# Patient Record
Sex: Male | Born: 1980 | Race: Black or African American | Hispanic: No | Marital: Single | Smoking: Former smoker
Health system: Southern US, Community
[De-identification: ages and names within clinical notes are randomized; demographics above are authoritative.]

## PROBLEM LIST (undated history)

## (undated) DIAGNOSIS — F1410 Cocaine abuse, uncomplicated: Secondary | ICD-10-CM

## (undated) DIAGNOSIS — K029 Dental caries, unspecified: Secondary | ICD-10-CM

## (undated) DIAGNOSIS — F329 Major depressive disorder, single episode, unspecified: Secondary | ICD-10-CM

## (undated) DIAGNOSIS — F32A Depression, unspecified: Secondary | ICD-10-CM

---
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
## 2001-09-10 ENCOUNTER — Emergency Department (HOSPITAL_COMMUNITY): Admission: EM | Admit: 2001-09-10 | Discharge: 2001-09-10 | Payer: Self-pay | Admitting: Emergency Medicine

## 2003-02-15 ENCOUNTER — Encounter: Payer: Self-pay | Admitting: *Deleted

## 2003-02-15 ENCOUNTER — Emergency Department (HOSPITAL_COMMUNITY): Admission: EM | Admit: 2003-02-15 | Discharge: 2003-02-15 | Payer: Self-pay | Admitting: *Deleted

## 2003-08-11 ENCOUNTER — Emergency Department (HOSPITAL_COMMUNITY): Admission: EM | Admit: 2003-08-11 | Discharge: 2003-08-11 | Payer: Self-pay | Admitting: Emergency Medicine

## 2004-11-23 ENCOUNTER — Emergency Department (HOSPITAL_COMMUNITY): Admission: EM | Admit: 2004-11-23 | Discharge: 2004-11-23 | Payer: Self-pay | Admitting: Emergency Medicine

## 2005-11-17 ENCOUNTER — Emergency Department (HOSPITAL_COMMUNITY): Admission: EM | Admit: 2005-11-17 | Discharge: 2005-11-18 | Payer: Self-pay | Admitting: Emergency Medicine

## 2005-11-30 ENCOUNTER — Emergency Department (HOSPITAL_COMMUNITY): Admission: AD | Admit: 2005-11-30 | Discharge: 2005-11-30 | Payer: Self-pay | Admitting: Family Medicine

## 2007-05-29 ENCOUNTER — Emergency Department (HOSPITAL_COMMUNITY): Admission: EM | Admit: 2007-05-29 | Discharge: 2007-05-29 | Payer: Self-pay | Admitting: Emergency Medicine

## 2007-09-17 ENCOUNTER — Emergency Department (HOSPITAL_COMMUNITY): Admission: EM | Admit: 2007-09-17 | Discharge: 2007-09-17 | Payer: Self-pay | Admitting: Family Medicine

## 2007-11-01 ENCOUNTER — Emergency Department (HOSPITAL_COMMUNITY): Admission: EM | Admit: 2007-11-01 | Discharge: 2007-11-01 | Payer: Self-pay | Admitting: Emergency Medicine

## 2008-05-19 ENCOUNTER — Emergency Department (HOSPITAL_COMMUNITY): Admission: EM | Admit: 2008-05-19 | Discharge: 2008-05-19 | Payer: Self-pay | Admitting: Emergency Medicine

## 2010-03-31 ENCOUNTER — Emergency Department (HOSPITAL_COMMUNITY): Admission: EM | Admit: 2010-03-31 | Discharge: 2010-03-31 | Payer: Self-pay | Admitting: Emergency Medicine

## 2010-04-02 ENCOUNTER — Emergency Department (HOSPITAL_COMMUNITY)
Admission: EM | Admit: 2010-04-02 | Discharge: 2010-04-02 | Payer: Self-pay | Source: Home / Self Care | Admitting: Family Medicine

## 2010-06-15 ENCOUNTER — Inpatient Hospital Stay (INDEPENDENT_AMBULATORY_CARE_PROVIDER_SITE_OTHER)
Admission: RE | Admit: 2010-06-15 | Discharge: 2010-06-15 | Disposition: A | Discharge status: Home / Self Care | Payer: Self-pay | Source: Ambulatory Visit | Attending: Family Medicine | Admitting: Family Medicine

## 2010-06-15 DIAGNOSIS — K602 Anal fissure, unspecified: Secondary | ICD-10-CM

## 2010-06-15 LAB — OCCULT BLOOD, POC DEVICE: Fecal Occult Bld: NEGATIVE

## 2010-06-19 ENCOUNTER — Emergency Department (HOSPITAL_COMMUNITY)
Admission: EM | Admit: 2010-06-19 | Discharge: 2010-06-19 | Disposition: A | Discharge status: Home / Self Care | Payer: Self-pay | Attending: Emergency Medicine | Admitting: Emergency Medicine

## 2010-06-19 DIAGNOSIS — J020 Streptococcal pharyngitis: Secondary | ICD-10-CM | POA: Insufficient documentation

## 2010-06-19 DIAGNOSIS — K122 Cellulitis and abscess of mouth: Secondary | ICD-10-CM | POA: Insufficient documentation

## 2010-06-19 DIAGNOSIS — R1310 Dysphagia, unspecified: Secondary | ICD-10-CM | POA: Insufficient documentation

## 2011-02-01 LAB — GC/CHLAMYDIA PROBE AMP, GENITAL
Chlamydia, DNA Probe: POSITIVE — AB
GC Probe Amp, Genital: POSITIVE — AB

## 2011-11-16 ENCOUNTER — Encounter (HOSPITAL_COMMUNITY): Payer: Self-pay

## 2011-11-16 ENCOUNTER — Emergency Department (HOSPITAL_COMMUNITY)
Admission: EM | Admit: 2011-11-16 | Discharge: 2011-11-16 | Disposition: A | Discharge status: Home / Self Care | Payer: Medicaid Other | Attending: Emergency Medicine | Admitting: Emergency Medicine

## 2011-11-16 DIAGNOSIS — R32 Unspecified urinary incontinence: Secondary | ICD-10-CM | POA: Insufficient documentation

## 2011-11-16 LAB — URINALYSIS, ROUTINE W REFLEX MICROSCOPIC
Bilirubin Urine: NEGATIVE
Hgb urine dipstick: NEGATIVE
Ketones, ur: NEGATIVE mg/dL
Nitrite: NEGATIVE
pH: 7 (ref 5.0–8.0)

## 2011-11-16 LAB — URINE MICROSCOPIC-ADD ON

## 2011-11-16 NOTE — ED Notes (Addendum)
States unable to hold urine at night states ongoing for several years, denies pain or discomfort states wants to know why this is going on, denies pain or discomfort

## 2011-11-16 NOTE — ED Provider Notes (Signed)
History     CSN: [id number]  Arrival date & time 11/16/11  1520   First MD Initiated Contact with Patient 11/16/11 1604      Chief Complaint  Patient presents with  . Urinary Incontinence    (Consider location/radiation/quality/duration/timing/severity/associated sxs/prior treatment) HPI Comments: Patient here requesting evaluation for episodic periods of incontinence.  He states that since he was a child, he has been having episodes of spontaneous nocturnal incontinence of urine.  He states that these occur about twice a month, he states that when he was a child, his PCP checked this out but did no tests.  He states that when they occur he has no sensation about the need to urinate, that he wakes up wet.  He denies any other symptoms including pain, spasms, fever, chills, dysuria, hematuria, nausea, vomiting, urethral discharge, penile pain, erectile dysfunction.  Patient is a 31 y.o. male presenting with male genitourinary complaint. The history is provided by the patient. No language interpreter was used.  Male GU Problem Primary symptoms include no dysuria, no genital itching, no genital lesions, no genital rash, no penile discharge, no penile pain, no priapism, and no scrotal pain. This is a chronic problem. The current episode started more than 1 week ago. The problem occurs every several days. The problem has not changed since onset.Pertinent negatives include no anorexia, no diaphoresis, no nausea, no vomiting, no abdominal pain, no abdominal swelling, no frequency, no constipation and no diarrhea. There has been no fever. He has tried nothing for the symptoms. The treatment provided no relief. Sexual activity: non-contributory.    History reviewed. No pertinent past medical history.  History reviewed. No pertinent past surgical history.  History reviewed. No pertinent family history.  History  Substance Use Topics  . Smoking status: Never Smoker   . Smokeless tobacco: Not on  file  . Alcohol Use: No      Review of Systems  Constitutional: Negative for diaphoresis.  Gastrointestinal: Negative for nausea, vomiting, abdominal pain, diarrhea, constipation and anorexia.  Genitourinary: Negative for dysuria, frequency, hematuria, decreased urine volume, discharge, penile pain, testicular pain and penile discharge.  All other systems reviewed and are negative.    Allergies  Review of patient's allergies indicates no known allergies.  Home Medications  No current outpatient prescriptions on file.  BP 112/76  Pulse 66  Temp 98.6 F (37 C) (Oral)  Resp 16  SpO2 100%  Physical Exam  Nursing note and vitals reviewed. Constitutional: He is oriented to person, place, and time. He appears well-developed and well-nourished. No distress.  HENT:  Head: Normocephalic and atraumatic.  Right Ear: External ear normal.  Left Ear: External ear normal.  Nose: Nose normal.  Mouth/Throat: Oropharynx is clear and moist. No oropharyngeal exudate.  Eyes: Conjunctivae are normal. Pupils are equal, round, and reactive to light. No scleral icterus.  Neck: Normal range of motion. Neck supple.  Cardiovascular: Normal rate, regular rhythm and normal heart sounds.  Exam reveals no gallop and no friction rub.   No murmur heard. Pulmonary/Chest: Effort normal and breath sounds normal. No respiratory distress. He has no wheezes. He has no rales. He exhibits no tenderness.  Abdominal: Soft. Bowel sounds are normal. He exhibits no distension. There is no tenderness.  Genitourinary: Penis normal. Right testis shows no mass and no tenderness. Left testis shows no mass and no tenderness.  Musculoskeletal: Normal range of motion. He exhibits no edema and no tenderness.  Lymphadenopathy:    He has no cervical  adenopathy.  Neurological: He is alert and oriented to person, place, and time. No cranial nerve deficit. He exhibits normal muscle tone. Coordination normal.  Skin: Skin is warm  and dry. No rash noted. No erythema. No pallor.  Psychiatric: He has a normal mood and affect. His behavior is normal. Judgment and thought content normal.    ED Course  Procedures (including critical care time)  Labs Reviewed  URINALYSIS, ROUTINE W REFLEX MICROSCOPIC - Abnormal; Notable for the following:    Protein, ur 30 (*)     Leukocytes, UA TRACE (*)     All other components within normal limits  URINE MICROSCOPIC-ADD ON   No results found.   1. Paradoxic incontinence       MDM  Patient with normal urine and bladder scan, he has been referred to Urology for further evaluation of this.  I do not suspect neurogenic bladder, spinal cord injury.        Izola Price [place], [place] 11/16/11 1802

## 2011-11-17 NOTE — ED Provider Notes (Signed)
Medical screening examination/treatment/procedure(s) were performed by non-physician practitioner and as supervising physician I was immediately available for consultation/collaboration.  Hurman Horn, MD 11/17/11 1239

## 2012-03-29 ENCOUNTER — Emergency Department (HOSPITAL_COMMUNITY)
Admission: EM | Admit: 2012-03-29 | Discharge: 2012-03-29 | Disposition: A | Discharge status: Home / Self Care | Payer: Self-pay | Attending: Emergency Medicine | Admitting: Emergency Medicine

## 2012-03-29 ENCOUNTER — Encounter (HOSPITAL_COMMUNITY): Payer: Self-pay | Admitting: *Deleted

## 2012-03-29 DIAGNOSIS — R0789 Other chest pain: Secondary | ICD-10-CM

## 2012-03-29 DIAGNOSIS — F17200 Nicotine dependence, unspecified, uncomplicated: Secondary | ICD-10-CM | POA: Insufficient documentation

## 2012-03-29 DIAGNOSIS — R071 Chest pain on breathing: Secondary | ICD-10-CM | POA: Insufficient documentation

## 2012-03-29 MED ORDER — IBUPROFEN 800 MG PO TABS
800.0000 mg | ORAL_TABLET | Freq: Once | ORAL | Status: AC
  Administered 2012-03-29: 800 mg via ORAL
  Filled 2012-03-29: qty 1

## 2012-03-29 MED ORDER — IBUPROFEN 800 MG PO TABS: 800.0000 mg | ORAL_TABLET | Freq: Three times a day (TID) | ORAL | Status: DC | PRN

## 2012-03-29 NOTE — ED Notes (Signed)
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 Pain x3 days. Pain upon inspiration 3/10. Pt denies falls/ trauma. Pt denies SOB

## 2012-03-29 NOTE — ED Provider Notes (Signed)
History     CSN: [id number]  Arrival date & time 03/29/12  1601   First MD Initiated Contact with Patient 03/29/12 1811      No chief complaint on file.   (Consider location/radiation/quality/duration/timing/severity/associated sxs/prior treatment) HPI Comments: Patient reports pain and tenderness over his right rib cage.  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  Presents today because he has been having constant pain that does not go away and he was concerned.  Pain is described as a cramp, does not radiate, is 2/10 intensity.  Has taken laxative, given by his mother, without change.  Pain is worse with movement and with deep respiration.  Denies fevers, cough, hemoptysis, SOB, abdominal pain, N/V/D, change in bowel habits, urinary symptoms.  He denies swelling of the legs, recent immobilization, any personal or family hx of blood clots.    The history is provided by the patient.    History reviewed. No pertinent past medical history.  History reviewed. No pertinent past surgical history.  History reviewed. No pertinent family history.  History  Substance Use Topics  . Smoking status: Current Every Day Smoker -- 0.3 packs/day for 15 years    Types: Cigarettes  . Smokeless tobacco: Not on file  . Alcohol Use: Yes     Comment: 3 shots/day      Review of Systems  Constitutional: Negative for fever and chills.  HENT: Negative for neck pain.   Respiratory: Negative for cough and shortness of breath.   Cardiovascular: Positive for chest pain. Negative for leg swelling.  Gastrointestinal: Negative for nausea, vomiting, abdominal pain and diarrhea.  Genitourinary: Negative for dysuria, urgency and frequency.  Skin: Negative for rash and wound.    Allergies  Review of patient's allergies indicates no known allergies.  Home Medications   Current Outpatient Rx  Name  Route  Sig   Dispense  Refill  . IBUPROFEN 800 MG PO TABS   Oral   Take 1 tablet (800 mg total) by mouth every 8 (eight) hours as needed for pain.   21 tablet   0     BP 126/74  Pulse 81  Temp 99 F (37.2 C) (Oral)  Resp 16  SpO2 100%  Physical Exam  Nursing note and vitals reviewed. Constitutional: He appears well-developed and well-nourished. No distress.  HENT:  Head: Normocephalic and atraumatic.  Neck: Neck supple.  Cardiovascular: Normal rate and regular rhythm.   Pulmonary/Chest: Effort normal and breath sounds normal. No respiratory distress. He has no wheezes. He has no rales. He exhibits tenderness.         Right chest wall tender to palpation, palpation reproduces the symptoms.    Abdominal: Soft. He exhibits no distension and no mass. There is no tenderness. There is no rebound and no guarding.  Neurological: He is alert. He exhibits normal muscle tone.  Skin: He is not diaphoretic.    ED Course  Procedures (including critical care time)  Labs Reviewed - No data to display No results found.   1. Chest wall pain     MDM  Afebrile, nontoxic, comfortable appearing male with right rib tenderness.  No known injury but occurred after patient had been doing repetitive task while bending over at work.  Pain reproducible with palpation.  Lungs are clear, no SOB, hemoptysis, cough.  O2 sat is 100% on room air, HR 81.  Very low risk  for PE.  Doubt PE clinically.  Abdomen is nontender.  No symptoms or exam findings to suggest intraabdominal process including cholecystitis.  Doubt ACS.  Likely musculoskeletal.  Pt d/c home with NSAIDs, PCP resources for follow up.  Discussed diagnosis, treatment plan, and follow up with patient.  Pt given return precautions.  Pt verbalizes understanding and agrees with plan.           [place], [place] 03/30/12 [phone]

## 2012-03-30 NOTE — ED Provider Notes (Signed)
Medical screening examination/treatment/procedure(s) were performed by non-physician practitioner and as supervising physician I was immediately available for consultation/collaboration.  Rica Heather R. Jaid Quirion, MD 03/30/12 2031 

## 2012-07-04 ENCOUNTER — Emergency Department (HOSPITAL_COMMUNITY)
Admission: EM | Admit: 2012-07-04 | Discharge: 2012-07-04 | Disposition: A | Discharge status: Home / Self Care | Payer: Self-pay | Attending: Emergency Medicine | Admitting: Emergency Medicine

## 2012-07-04 ENCOUNTER — Encounter (HOSPITAL_COMMUNITY): Payer: Self-pay | Admitting: Emergency Medicine

## 2012-07-04 DIAGNOSIS — R1084 Generalized abdominal pain: Secondary | ICD-10-CM | POA: Insufficient documentation

## 2012-07-04 DIAGNOSIS — R197 Diarrhea, unspecified: Secondary | ICD-10-CM | POA: Insufficient documentation

## 2012-07-04 DIAGNOSIS — R1110 Vomiting, unspecified: Secondary | ICD-10-CM | POA: Insufficient documentation

## 2012-07-04 DIAGNOSIS — F17200 Nicotine dependence, unspecified, uncomplicated: Secondary | ICD-10-CM | POA: Insufficient documentation

## 2012-07-04 DIAGNOSIS — R6883 Chills (without fever): Secondary | ICD-10-CM | POA: Insufficient documentation

## 2012-07-04 MED ORDER — ONDANSETRON 8 MG PO TBDP: ORAL_TABLET | ORAL | Status: DC

## 2012-07-04 MED ORDER — METOCLOPRAMIDE HCL 10 MG PO TABS: 10.0000 mg | ORAL_TABLET | Freq: Four times a day (QID) | ORAL | Status: DC | PRN

## 2012-07-04 MED ORDER — ONDANSETRON 8 MG PO TBDP
8.0000 mg | ORAL_TABLET | Freq: Once | ORAL | Status: DC
  Filled 2012-07-04: qty 1

## 2012-07-04 MED ORDER — LOPERAMIDE HCL 2 MG PO CAPS: ORAL_CAPSULE | ORAL | Status: DC

## 2012-07-04 MED ORDER — ONDANSETRON 4 MG PO TBDP
4.0000 mg | ORAL_TABLET | Freq: Once | ORAL | Status: AC
  Administered 2012-07-04: 4 mg via ORAL
  Filled 2012-07-04: qty 1

## 2012-07-04 NOTE — ED Provider Notes (Signed)
History     CSN: [id number]  Arrival date & time 07/04/12  1655   First MD Initiated Contact with Patient 07/04/12 1909      Chief Complaint  Patient presents with  . Emesis  . Diarrhea    (Consider location/radiation/quality/duration/timing/severity/associated sxs/prior treatment) HPI This 32 year old male has one day of multiple episodes each of nonbloody vomiting and diarrhea with intermittent diffuse crampy abdominal pain but no constant or severe or localized abdominal pain, he did have some chills today he has no cough chest pain shortness of breath testicular pain dysuria body aches or other concerns. There is no treatment prior to arrival. No past medical history on file.  No past surgical history on file.  No family history on file.  History  Substance Use Topics  . Smoking status: Current Every Day Smoker -- 0.30 packs/day for 15 years    Types: Cigarettes  . Smokeless tobacco: Never Used  . Alcohol Use: Yes     Comment: 3 shots/day, wine red      Review of Systems 10 Systems reviewed and are negative for acute change except as noted in the HPI. Allergies  Review of patient's allergies indicates no known allergies.  Home Medications   Current Outpatient Rx  Name  Route  Sig  Dispense  Refill  . loperamide (IMODIUM) 2 MG capsule      Take two tabs po initially, then one tab after each loose stool: max 8 tabs in 24 hours   12 capsule   0   . metoCLOPramide (REGLAN) 10 MG tablet   Oral   Take 1 tablet (10 mg total) by mouth every 6 (six) hours as needed (nausea/headache).   6 tablet   0   . ondansetron (ZOFRAN ODT) 8 MG disintegrating tablet      8mg  ODT q4 hours prn nausea   4 tablet   0     BP 130/84  Pulse 94  Temp(Src) 97.7 F (36.5 C) (Oral)  Resp 18  SpO2 98%  Physical Exam  Nursing note and vitals reviewed. Constitutional:  Awake, alert, nontoxic appearance.  HENT:  Head: Atraumatic.  Eyes: Right eye exhibits no discharge.  Left eye exhibits no discharge.  Neck: Neck supple.  Cardiovascular: Normal rate and regular rhythm.   No murmur heard. Pulmonary/Chest: Effort normal and breath sounds normal. No respiratory distress. He has no wheezes. He has no rales. He exhibits no tenderness.  Abdominal: Soft. Bowel sounds are normal. He exhibits no distension and no mass. There is no tenderness. There is no rebound and no guarding.  Genitourinary:  Testicles are nontender, no palpable inguinal hernias, no CVA tenderness  Musculoskeletal: He exhibits no tenderness.  Baseline ROM, no obvious new focal weakness.  Neurological:  Mental status and motor strength appears baseline for patient and situation.  Skin: No rash noted.  Psychiatric: He has a normal mood and affect.    ED Course  Procedures (including critical care time) Pt feels improved after observation and/or treatment in ED. Labs Reviewed - No data to display No results found.   1. Vomiting and diarrhea       MDM  Pt stable in ED with no significant deterioration in condition.  Patient / Family / Caregiver informed of clinical course, understand medical decision-making process, and agree with plan.  I doubt any other EMC precluding discharge at this time including, but not necessarily limited to the following:sepsis.        Hurman Horn, MD  07/13/12 1640 

## 2012-07-04 NOTE — ED Notes (Signed)
Pt presents w/ onset of emesis and diarrhea this morning. Denies abdominal pain except muscular pain from emesis. Pt repeatedly demanding ice water during triage.. Endorses nausea at present.

## 2012-07-04 NOTE — ED Notes (Signed)
Writer reviewed discharge instructions with patient. Patient requested a wheelchair to be taken to discharge as well as additional nausea medication prior to discharge. Patient requested to remain in room while orders for medication were sought. Orders received. When writer returned to patient room to give medication and discharge patient, he was not in the room. [facility] office does not have record of patient checking out, restrooms checked, patient not found. Patient left without signing.

## 2012-12-25 ENCOUNTER — Emergency Department (HOSPITAL_COMMUNITY)
Admission: EM | Admit: 2012-12-25 | Discharge: 2012-12-26 | Disposition: A | Discharge status: Other Acute Inpatient Hospital | Payer: Self-pay | Attending: Emergency Medicine | Admitting: Emergency Medicine

## 2012-12-25 ENCOUNTER — Encounter (HOSPITAL_COMMUNITY): Payer: Self-pay | Admitting: Emergency Medicine

## 2012-12-25 DIAGNOSIS — F1910 Other psychoactive substance abuse, uncomplicated: Secondary | ICD-10-CM | POA: Insufficient documentation

## 2012-12-25 DIAGNOSIS — F3289 Other specified depressive episodes: Secondary | ICD-10-CM | POA: Insufficient documentation

## 2012-12-25 DIAGNOSIS — F329 Major depressive disorder, single episode, unspecified: Secondary | ICD-10-CM | POA: Insufficient documentation

## 2012-12-25 DIAGNOSIS — F17200 Nicotine dependence, unspecified, uncomplicated: Secondary | ICD-10-CM | POA: Insufficient documentation

## 2012-12-25 HISTORY — DX: Depression, unspecified: F32.A

## 2012-12-25 HISTORY — DX: Major depressive disorder, single episode, unspecified: F32.9

## 2012-12-25 LAB — URINALYSIS, ROUTINE W REFLEX MICROSCOPIC
Glucose, UA: NEGATIVE mg/dL
Ketones, ur: 15 mg/dL — AB
Leukocytes, UA: NEGATIVE
Nitrite: NEGATIVE
Protein, ur: 100 mg/dL — AB
pH: 6.5 (ref 5.0–8.0)

## 2012-12-25 LAB — CBC WITH DIFFERENTIAL/PLATELET
Eosinophils Absolute: 0.2 10*3/uL (ref 0.0–0.7)
Hemoglobin: 16 g/dL (ref 13.0–17.0)
Lymphocytes Relative: 18 % (ref 12–46)
Lymphs Abs: 2.2 10*3/uL (ref 0.7–4.0)
MCH: 31.6 pg (ref 26.0–34.0)
Monocytes Relative: 9 % (ref 3–12)
Neutro Abs: 8.7 10*3/uL — ABNORMAL HIGH (ref 1.7–7.7)
Neutrophils Relative %: 72 % (ref 43–77)
Platelets: 240 10*3/uL (ref 150–400)
RBC: 5.07 MIL/uL (ref 4.22–5.81)
WBC: 12 10*3/uL — ABNORMAL HIGH (ref 4.0–10.5)

## 2012-12-25 LAB — COMPREHENSIVE METABOLIC PANEL
ALT: 12 U/L (ref 0–53)
Alkaline Phosphatase: 50 U/L (ref 39–117)
BUN: 14 mg/dL (ref 6–23)
CO2: 30 mEq/L (ref 19–32)
Chloride: 101 mEq/L (ref 96–112)
GFR calc Af Amer: >90 mL/min (ref >90)
GFR calc non Af Amer: >90 mL/min (ref >90)
Glucose, Bld: 95 mg/dL (ref 70–99)
Potassium: 3.2 mEq/L — ABNORMAL LOW (ref 3.5–5.1)
Sodium: 142 mEq/L (ref 135–145)
Total Bilirubin: 0.4 mg/dL (ref 0.3–1.2)

## 2012-12-25 LAB — ETHANOL: Alcohol, Ethyl (B): <11 mg/dL (ref 0–11)

## 2012-12-25 LAB — URINE MICROSCOPIC-ADD ON

## 2012-12-25 LAB — RAPID URINE DRUG SCREEN, HOSP PERFORMED: Barbiturates: NOT DETECTED

## 2012-12-25 MED ORDER — ACETAMINOPHEN 325 MG PO TABS
650.0000 mg | ORAL_TABLET | ORAL | Status: DC | PRN
  Administered 2012-12-25: 650 mg via ORAL
  Filled 2012-12-25: qty 2

## 2012-12-25 MED ORDER — ZOLPIDEM TARTRATE 5 MG PO TABS: 5.0000 mg | ORAL_TABLET | Freq: Every evening | ORAL | Status: DC | PRN

## 2012-12-25 MED ORDER — LORAZEPAM 1 MG PO TABS: 1.0000 mg | ORAL_TABLET | Freq: Three times a day (TID) | ORAL | Status: DC | PRN

## 2012-12-25 MED ORDER — NICOTINE 21 MG/24HR TD PT24: 21.0000 mg | MEDICATED_PATCH | Freq: Every day | TRANSDERMAL | Status: DC

## 2012-12-25 MED ORDER — ALUM & MAG HYDROXIDE-SIMETH 200-200-20 MG/5ML PO SUSP: 30.0000 mL | ORAL | Status: DC | PRN

## 2012-12-25 MED ORDER — ONDANSETRON HCL 4 MG PO TABS: 4.0000 mg | ORAL_TABLET | Freq: Three times a day (TID) | ORAL | Status: DC | PRN

## 2012-12-25 NOTE — ED Notes (Signed)
PT AWAKENED TO PREPARE FOR TELEACT

## 2012-12-25 NOTE — ED Notes (Signed)
teleact completed. Pt upset.

## 2012-12-25 NOTE — Progress Notes (Signed)
Thomas Cobb, MHT prepared support paper work with patient who has been accepted to [facility] for treatment assisted attending nurse with transfer. Patient was transferred by security.

## 2012-12-25 NOTE — ED Notes (Addendum)
PATIENT TO ED TODAY SEEKING DETOX FROM ALCOHOL AND COCAINE. PT WITH POLYSUBSTANCE ABOUSE.. OTHER THAN THE ALCOHOL AND COCAINE HE ALSO USES MARIJUANA DAILY AND USES "MOLLY" (EXCTASY) OCCASIONALLY. ALCOHOL USE IS DAILY .Marland KitchenMarland Kitchen1/2 PINT MORE OR LESS DAILY. COCAINE USE IS 1 GRAM OF COCAINE 3-4 TIMES A WEEK. HE SNORTS THE COCAINE. HE HAS LOST HIS JOB BECAUSE HE FINDS IT HARD TO GET UP AND GO TO WORK AFTER USING COCAINE ALL NIGHT. HE IS BEHIND ON HIS HOUSEHOLD BILLS AND CAR PAYMENT. IT IS AFFECTING HIS RELATIONSHIP WITH HIS 2 CHILDREN AND WITH HIS CURRENT GF. GF IS VERY SUPPORTIVE OF PT SEEKING DETOX AND TREATMENT. PT DID GO THRU THE COURT ORDERED [employer]Y PROGRAM BUT STATES HE DIDN'T THINK HE WAS "THAT BAD" THEN. HE ADMITS TO A PENDING COURT DATE ON SEPTEMEBER 8TH.

## 2012-12-25 NOTE — ED Notes (Addendum)
Pt reports cocaine abuse x 12 years. Pt states he has gone to rehab programs that were required through probation "that I really didn't take very seriously." Pt states he "uses as much cocaine as I can. I do it until I can't do it anymore." Last used last evening. Pt calm and cooperative. Denies SI/HI. Pt wants to get clean today "because my life just isn't going like it should. Instead of inclining in life, I am declining. It's affecting my spending, my job, and my relationship with my children."

## 2012-12-25 NOTE — ED Notes (Signed)
States has cocaine problem and if he doesn't get off may hurt himself

## 2012-12-25 NOTE — BH Assessment (Signed)
Tele Assessment Note   Thomas Cobb is an 32 y.o. male that was seen via tele assessment this day after presenting to [facility] requesting detox from alcohol and cocaine.  Pt reported he "needs help" and "I can't do it on my own."  Pt stated his girlfriend brought him in for help.  Pt stated he drinks 1-2 shots-1 pint liquor daily, last drink last night.  Pt stated smokes 1 blunt marijuana daily, last smoked last night.  Pt stated uses cocaine 3-4 x/week, last use last night and pt stated he uses 1-2 grams when he uses.  Pt stated his SA is interfering with his relationship with his girlfriend and children, as well as causing financial stress.  Pt endorses sx of depression.  Pt denies SI, HI or psychosis.  Pt denies any previous MH or SA treatment.  Pt stated he has never tried to get sober on his own.  Pt stated he has a court date for possession of pills with no label on 01/18/13.  Pt stated he never used the pills, but that his friend gave them to him.  Pt denies current withdrawal sx.  Pt did get irritable toward end of assessment because he was told he could not have sharps or shoe strings on the unit if accepted at [facility] for detox.  Pt stated, "you are how you look.  Do I look retarded to you?"  Completed tele assessment.  Updated pt's ED nurse.  Updated BH staff and will run for possible admission for detox.  Axis I: 304.80 Polysubstance Dependence, 311 Depressive Disorder NOS Axis II: Deferred Axis III:  Past Medical History  Diagnosis Date  . Depression    Axis IV: economic problems, other psychosocial or environmental problems, problems related to legal system/crime, problems related to social environment and problems with primary support group Axis V: 31-40 impairment in reality testing  Past Medical History:  Past Medical History  Diagnosis Date  . Depression     No past surgical history on file.  Family History: No family history on file.  Social History:  reports that he has been  smoking Cigarettes.  He has a 4.5 pack-year smoking history. He has never used smokeless tobacco. He reports that  drinks alcohol. He reports that he does not use illicit drugs.  Additional Social History:  Alcohol / Drug Use Pain Medications: see MAR Prescriptions: see MAR Over the Counter: see MAR History of alcohol / drug use?: Yes Longest period of sobriety (when/how long): unknown Negative Consequences of Use: Financial;Legal;Personal relationships Withdrawal Symptoms:  (pt denies) Substance #1 Name of Substance 1: Alcohol 1 - Age of First Use: 15 1 - Amount (size/oz): 1-2 shots to 1 pint of liquor  1 - Frequency: daily 1 - Duration: ongoing 1 - Last Use / Amount: 12/24/12 - 1/2 pint liquor Substance #2 Name of Substance 2: Cocaine 2 - Age of First Use: 18 2 - Amount (size/oz): 1-2 grams 2 - Frequency: 3-4 x/week 2 - Duration: ongoing 2 - Last Use / Amount: 12/24/12 - 1-2 grams Substance #3 Name of Substance 3: Mollies  3 - Age of First Use: 29 3 - Amount (size/oz): 1 pill 3 - Frequency: 1-2x/every 6 months 3 - Duration: ongoing 3 - Last Use / Amount: 6 mos ago Substance #4 Name of Substance 4: Marijuana 4 - Age of First Use: 16 4 - Amount (size/oz): 1 blunt 4 - Frequency: daily 4 - Duration: ongoing 4 - Last Use / Amount:  12/24/12 - 1 blunt  CIWA: CIWA-Ar BP: 117/73 mmHg Pulse Rate: 62 Nausea and Vomiting: no nausea and no vomiting Tactile Disturbances: none Tremor: no tremor Auditory Disturbances: not present Paroxysmal Sweats: no sweat visible Visual Disturbances: not present Anxiety: two Headache, Fullness in Head: none present Agitation: somewhat more than normal activity Orientation and Clouding of Sensorium: oriented and can do serial additions CIWA-Ar Total: 3 COWS: Clinical Opiate Withdrawal Scale (COWS) Resting Pulse Rate: Pulse Rate 80 or below Sweating: No report of chills or flushing Restlessness: Able to sit still Pupil Size: Pupils moderately  dilated Bone or Joint Aches: Not present Runny Nose or Tearing: Not present GI Upset: No GI symptoms Tremor: No tremor Yawning: No yawning Anxiety or Irritability: None Gooseflesh Skin: Skin is smooth COWS Total Score: 2  Allergies: No Known Allergies  Home Medications:  (Not in a hospital admission)  OB/GYN Status:  No LMP for male patient.  General Assessment Data Location of Assessment: [facility] Is this a Tele or Face-to-Face Assessment?: Tele Assessment Is this an Initial Assessment or a Re-assessment for this encounter?: Initial Assessment Living Arrangements: Spouse/significant other Can pt return to current living arrangement?: Yes Admission Status: Voluntary Is patient capable of signing voluntary admission?: Yes Transfer from: Acute Hospital Referral Source: Self/Family/Friend     [facility] Crisis Care Plan Living Arrangements: Spouse/significant other Name of Psychiatrist: none Name of Therapist: none  Education Status Is patient currently in school?: No  Risk to self Suicidal Ideation: No Suicidal Intent: No Is patient at risk for suicide?: No Suicidal Plan?: No Access to Means: No What has been your use of drugs/alcohol within the last 12 months?: pt admits to ETOH, THC, cocaine, and Molly use Previous Attempts/Gestures: No How many times?: 0 Other Self Harm Risks: pt denies Triggers for Past Attempts: None known Intentional Self Injurious Behavior: Damaging Comment - Self Injurious Behavior: ongoing SA Family Suicide History: No Recent stressful life event(s): Conflict (Comment);Financial Problems;Legal Issues;Recent negative physical changes (SA, legal, conflict, financial) Persecutory voices/beliefs?: No Depression: Yes Depression Symptoms: Despondent;Insomnia;Tearfulness;Guilt;Loss of interest in usual pleasures;Feeling worthless/self pity;Feeling angry/irritable Substance abuse history and/or treatment for substance abuse?: No Suicide  prevention information given to non-admitted patients: Not applicable  Risk to Others Homicidal Ideation: No Thoughts of Harm to Others: No Current Homicidal Intent: No Current Homicidal Plan: No Access to Homicidal Means: No Identified Victim: pt denies History of harm to others?: No Assessment of Violence: None Noted Violent Behavior Description: na - pt calm, cooperative Does patient have access to weapons?: No Criminal Charges Pending?: Yes Describe Pending Criminal Charges: possession of pills with no label Does patient have a court date: Yes Court Date: 01/18/13  Psychosis Hallucinations: None noted Delusions: None noted  Mental Status Report Appear/Hygiene: Disheveled Eye Contact: Fair Motor Activity: Freedom of movement;Unremarkable Speech: Logical/coherent Level of Consciousness: Alert Mood: Depressed;Angry Affect: Depressed;Angry Anxiety Level: Moderate Thought Processes: Coherent;Relevant Judgement: Impaired Orientation: Person;Place;Time;Situation;Appropriate for developmental age Obsessive Compulsive Thoughts/Behaviors: None  Cognitive Functioning Concentration: Decreased Memory: Recent Intact;Remote Intact IQ: Average Insight: Poor Impulse Control: Poor Appetite: Poor Weight Loss: 0 Weight Gain: 0 Sleep: Decreased Total Hours of Sleep: 5 Vegetative Symptoms: None  ADLScreening [facility] Assessment Services) Patient's cognitive ability adequate to safely complete daily activities?: Yes Patient able to express need for assistance with ADLs?: Yes Independently performs ADLs?: Yes (appropriate for developmental age)  Prior Inpatient Therapy Prior Inpatient Therapy: No Prior Therapy Dates: na Prior Therapy Facilty/Provider(s): na Reason for Treatment: na  Prior Outpatient Therapy  Prior Outpatient Therapy: No Prior Therapy Dates: na Prior Therapy Facilty/Provider(s): na Reason for Treatment: na  ADL Screening (condition at time of  admission) Patient's cognitive ability adequate to safely complete daily activities?: Yes Is the patient deaf or have difficulty hearing?: No Does the patient have difficulty seeing, even when wearing glasses/contacts?: No Does the patient have difficulty concentrating, remembering, or making decisions?: No Patient able to express need for assistance with ADLs?: Yes Does the patient have difficulty dressing or bathing?: No Independently performs ADLs?: Yes (appropriate for developmental age) Does the patient have difficulty walking or climbing stairs?: No  Home Assistive Devices/Equipment Home Assistive Devices/Equipment: None    Abuse/Neglect Assessment (Assessment to be complete while patient is alone) Physical Abuse: Denies Verbal Abuse: Denies Sexual Abuse: Denies Exploitation of patient/patient's resources: Denies Self-Neglect: Denies Values / Beliefs Cultural Requests During Hospitalization: None Spiritual Requests During Hospitalization: None Consults Spiritual Care Consult Needed: No Social Work Consult Needed: No Merchant navy officer (For Healthcare) Advance Directive: Patient does not have advance directive;Patient would not like information    Additional Information 1:1 In Past 12 Months?: No CIRT Risk: No Elopement Risk: No Does patient have medical clearance?: Yes     Disposition:  Disposition Initial Assessment Completed for this Encounter: Yes Disposition of Patient: Referred to;Inpatient treatment program Type of inpatient treatment program: Adult Patient referred to: Other (Comment) (Pending [facility])  Caryl Comes 12/25/2012 3:43 PM

## 2012-12-25 NOTE — ED Provider Notes (Signed)
CSN: [id number]     Arrival date & time 12/25/12  1112 History     First MD Initiated Contact with Patient 12/25/12 1123     Chief Complaint  Patient presents with  . Medical Clearance   (Consider location/radiation/quality/duration/timing/severity/associated sxs/prior Treatment) HPI Comments: Patient is a 32 year old male who presents today for detox from cocaine. He states he been using cocaine for the past 12 years. He uses cocaine daily and "goes until he can go anymore". Last time he is cocaine was around 3 AM last night. He also uses marijuana. He drinks a pint of liquor every 2 days. He reports that he attempted to detox once in the past because he had drug charges against him and it was court ordered. He did not take this seriously which is what he believes he was not successful. He would like to stop using cocaine this time because he feels as though his life is getting out of control. He is spending too much money and does not have a good relationship with his daughter. He denies suicidal and homicidal ideation. He feels physically well, but reports a lot of stress in his life currently. He specifically denies chest pain, shortness of breath, nausea, vomiting, diarrhea, abdominal pain, numbness, weakness, paresthesias. This is his first visit in the emergency department for detox from cocaine.  The history is provided by the patient. No language interpreter was used.    Past Medical History  Diagnosis Date  . Depression    No past surgical history on file. No family history on file. History  Substance Use Topics  . Smoking status: Current Every Day Smoker -- 0.30 packs/day for 15 years    Types: Cigarettes  . Smokeless tobacco: Never Used  . Alcohol Use: Yes     Comment: 3 shots/day, wine red    Review of Systems  Constitutional: Positive for fatigue. Negative for fever and chills.  Respiratory: Negative for shortness of breath.   Cardiovascular: Negative for chest pain.   Gastrointestinal: Negative for nausea, vomiting, abdominal pain, diarrhea and constipation.  Psychiatric/Behavioral: Negative for suicidal ideas.  All other systems reviewed and are negative.    Allergies  Review of patient's allergies indicates no known allergies.  Home Medications   Current Outpatient Rx  Name  Route  Sig  Dispense  Refill  . loperamide (IMODIUM) 2 MG capsule      Take two tabs po initially, then one tab after each loose stool: max 8 tabs in 24 hours   12 capsule   0   . metoCLOPramide (REGLAN) 10 MG tablet   Oral   Take 1 tablet (10 mg total) by mouth every 6 (six) hours as needed (nausea/headache).   6 tablet   0   . ondansetron (ZOFRAN ODT) 8 MG disintegrating tablet      8mg  ODT q4 hours prn nausea   4 tablet   0    BP 117/70  Pulse 80  Temp(Src) 98.1 F (36.7 C) (Oral)  Resp 16  SpO2 97% Physical Exam  Nursing note and vitals reviewed. Constitutional: He is oriented to person, place, and time. He appears well-developed and well-nourished. No distress.  HENT:  Head: Normocephalic and atraumatic.  Right Ear: External ear normal.  Left Ear: External ear normal.  Nose: Nose normal.  Eyes: Conjunctivae are normal.  Neck: Normal range of motion. No tracheal deviation present.  Cardiovascular: Normal rate, regular rhythm and normal heart sounds.   Pulmonary/Chest: Effort normal and breath  sounds normal. No stridor.  Abdominal: Soft. He exhibits no distension. There is no tenderness.  Musculoskeletal: Normal range of motion.  Neurological: He is alert and oriented to person, place, and time.  Skin: Skin is warm and dry. He is not diaphoretic.  Psychiatric: He has a normal mood and affect. His behavior is normal.    ED Course   Procedures (including critical care time)  Labs Reviewed  CBC WITH DIFFERENTIAL - Abnormal; Notable for the following:    WBC 12.0 (*)    MCHC 38.1 (*)    Neutro Abs 8.7 (*)    All other components within  normal limits  COMPREHENSIVE METABOLIC PANEL - Abnormal; Notable for the following:    Potassium 3.2 (*)    All other components within normal limits  URINE RAPID DRUG SCREEN (HOSP PERFORMED) - Abnormal; Notable for the following:    Cocaine POSITIVE (*)    Tetrahydrocannabinol POSITIVE (*)    All other components within normal limits  URINALYSIS, ROUTINE W REFLEX MICROSCOPIC - Abnormal; Notable for the following:    Bilirubin Urine SMALL (*)    Ketones, ur 15 (*)    Protein, ur 100 (*)    All other components within normal limits  ETHANOL  URINE MICROSCOPIC-ADD ON    Date: 12/25/2012  Rate: 61  Rhythm: normal sinus rhythm  QRS Axis: normal  Intervals: normal  ST/T Wave abnormalities: nonspecific ST/T changes  Conduction Disutrbances:none  Narrative Interpretation:   Old EKG Reviewed: none available    No results found. 1. Substance abuse     MDM  Patient presents for detox from heroin. No SI HI. No physical complaints. EKG and labs within normal limits. Patient is medically cleared. The patient is assessed by behavioral health and is currently awaiting placement in an inpatient facility.    Mora Bellman, PA-C 12/25/12 1904

## 2012-12-25 NOTE — ED Notes (Signed)
TELEACT IN PROGRESS. KRISTEN COUNSELOR

## 2012-12-26 ENCOUNTER — Encounter (HOSPITAL_COMMUNITY): Payer: Self-pay | Admitting: Behavioral Health

## 2012-12-26 ENCOUNTER — Inpatient Hospital Stay (HOSPITAL_COMMUNITY)
Admission: AD | Admit: 2012-12-26 | Discharge: 2012-12-30 | DRG: 897 | Disposition: A | Discharge status: Home / Self Care | Payer: No Typology Code available for payment source | Source: Intra-hospital | Attending: Psychiatry | Admitting: Psychiatry

## 2012-12-26 DIAGNOSIS — Z79899 Other long term (current) drug therapy: Secondary | ICD-10-CM

## 2012-12-26 DIAGNOSIS — F1414 Cocaine abuse with cocaine-induced mood disorder: Secondary | ICD-10-CM | POA: Diagnosis present

## 2012-12-26 DIAGNOSIS — F1410 Cocaine abuse, uncomplicated: Secondary | ICD-10-CM | POA: Diagnosis present

## 2012-12-26 DIAGNOSIS — F1010 Alcohol abuse, uncomplicated: Secondary | ICD-10-CM | POA: Diagnosis present

## 2012-12-26 DIAGNOSIS — F1020 Alcohol dependence, uncomplicated: Principal | ICD-10-CM | POA: Diagnosis present

## 2012-12-26 DIAGNOSIS — F4310 Post-traumatic stress disorder, unspecified: Secondary | ICD-10-CM | POA: Diagnosis present

## 2012-12-26 MED ORDER — NEOMYCIN-POLYMYXIN-GRAMICIDIN 1.75-10000-.025 OP SOLN
1.0000 [drp] | Freq: Four times a day (QID) | OPHTHALMIC | Status: DC
  Administered 2012-12-26 – 2012-12-30 (×16): 1 [drp] via OPHTHALMIC
  Filled 2012-12-26: qty 10

## 2012-12-26 MED ORDER — MAGNESIUM HYDROXIDE 400 MG/5ML PO SUSP: 30.0000 mL | Freq: Every day | ORAL | Status: DC | PRN

## 2012-12-26 MED ORDER — CHLORDIAZEPOXIDE HCL 25 MG PO CAPS
25.0000 mg | ORAL_CAPSULE | Freq: Four times a day (QID) | ORAL | Status: AC
  Administered 2012-12-26 (×3): 25 mg via ORAL
  Filled 2012-12-26 (×3): qty 1

## 2012-12-26 MED ORDER — ADULT MULTIVITAMIN W/MINERALS CH
1.0000 | ORAL_TABLET | Freq: Every day | ORAL | Status: DC
  Administered 2012-12-26 – 2012-12-30 (×5): 1 via ORAL
  Filled 2012-12-26 (×7): qty 1

## 2012-12-26 MED ORDER — HYDROXYZINE HCL 25 MG PO TABS
25.0000 mg | ORAL_TABLET | Freq: Four times a day (QID) | ORAL | Status: AC | PRN
  Administered 2012-12-28: 25 mg via ORAL

## 2012-12-26 MED ORDER — CHLORDIAZEPOXIDE HCL 25 MG PO CAPS: 25.0000 mg | ORAL_CAPSULE | Freq: Four times a day (QID) | ORAL | Status: AC | PRN

## 2012-12-26 MED ORDER — CHLORDIAZEPOXIDE HCL 25 MG PO CAPS
25.0000 mg | ORAL_CAPSULE | Freq: Three times a day (TID) | ORAL | Status: AC
  Administered 2012-12-27: 25 mg via ORAL
  Filled 2012-12-26 (×3): qty 1

## 2012-12-26 MED ORDER — NEOMYCIN-POLYMYXIN-GRAMICIDIN 1.75-10000-.025 OP SOLN
1.0000 [drp] | Freq: Four times a day (QID) | OPHTHALMIC | Status: DC
  Filled 2012-12-26: qty 10

## 2012-12-26 MED ORDER — ONDANSETRON 4 MG PO TBDP: 4.0000 mg | ORAL_TABLET | Freq: Four times a day (QID) | ORAL | Status: AC | PRN

## 2012-12-26 MED ORDER — THIAMINE HCL 100 MG/ML IJ SOLN: 100.0000 mg | Freq: Once | INTRAMUSCULAR | Status: DC

## 2012-12-26 MED ORDER — TRAZODONE HCL 50 MG PO TABS
50.0000 mg | ORAL_TABLET | Freq: Every evening | ORAL | Status: DC | PRN
  Administered 2012-12-26 – 2012-12-27 (×2): 50 mg via ORAL
  Filled 2012-12-26 (×3): qty 1

## 2012-12-26 MED ORDER — MENTHOL 3 MG MT LOZG: 1.0000 | LOZENGE | OROMUCOSAL | Status: DC | PRN

## 2012-12-26 MED ORDER — ACETAMINOPHEN 325 MG PO TABS
650.0000 mg | ORAL_TABLET | Freq: Four times a day (QID) | ORAL | Status: DC | PRN
  Administered 2012-12-30: 650 mg via ORAL

## 2012-12-26 MED ORDER — VITAMIN B-1 100 MG PO TABS
100.0000 mg | ORAL_TABLET | Freq: Every day | ORAL | Status: DC
  Administered 2012-12-27 – 2012-12-30 (×4): 100 mg via ORAL
  Filled 2012-12-26 (×6): qty 1

## 2012-12-26 MED ORDER — BACITRACIN-POLYMYXIN B OP OINT: TOPICAL_OINTMENT | OPHTHALMIC | Status: DC

## 2012-12-26 MED ORDER — POTASSIUM CHLORIDE CRYS ER 10 MEQ PO TBCR
10.0000 meq | EXTENDED_RELEASE_TABLET | Freq: Two times a day (BID) | ORAL | Status: AC
  Administered 2012-12-26 – 2012-12-29 (×6): 10 meq via ORAL
  Filled 2012-12-26 (×6): qty 1

## 2012-12-26 MED ORDER — LOPERAMIDE HCL 2 MG PO CAPS: 2.0000 mg | ORAL_CAPSULE | ORAL | Status: AC | PRN

## 2012-12-26 MED ORDER — CHLORDIAZEPOXIDE HCL 25 MG PO CAPS
25.0000 mg | ORAL_CAPSULE | ORAL | Status: AC
  Filled 2012-12-26: qty 1

## 2012-12-26 MED ORDER — ALUM & MAG HYDROXIDE-SIMETH 200-200-20 MG/5ML PO SUSP: 30.0000 mL | ORAL | Status: DC | PRN

## 2012-12-26 MED ORDER — CHLORDIAZEPOXIDE HCL 25 MG PO CAPS: 25.0000 mg | ORAL_CAPSULE | Freq: Every day | ORAL | Status: AC

## 2012-12-26 NOTE — H&P (Signed)
  Pt was seen by me today and I agree with the key elements documented in H&P.  

## 2012-12-26 NOTE — [facility] Group Notes (Signed)
[facility] Group Notes:  (Nursing/MHT/Case Management/Adjunct)  Date:  12/26/2012  Time:  2:58 PM  Type of Therapy:  Nurse Education  Participation Level:  Active  Participation Quality:  Appropriate  Affect:  Appropriate  Cognitive:  Appropriate  Insight:  Improving  Engagement in Group:  Improving  Modes of Intervention:  Problem-solving  Summary of Progress/Problems:  Thomas Cobb 12/26/2012, 2:58 PM

## 2012-12-26 NOTE — Progress Notes (Signed)
Adult Psychoeducational Group Note  Date:  12/26/2012 Time:  6:37 PM  Group Topic/Focus:  Healthy Communication:   The focus of this group is to discuss communication, barriers to communication, as well as healthy ways to communicate with others.  Participation Level:  Minimal  Participation Quality:  Appropriate  Affect:  Appropriate  Cognitive:  Appropriate  Insight: Improving  Engagement in Group:  Lacking  Modes of Intervention:  Discussion, Socialization and Support   Thomas Cobb 12/26/2012, 6:37 PM

## 2012-12-26 NOTE — H&P (Signed)
Psychiatric Admission Assessment Adult  Patient Identification:  Thomas Cobb Date of Evaluation:  12/26/2012 Chief Complaint:  DEPRESSIVE D/O,NOS POLYSUBSTANCE DEPENDENCE History of Present Illness:  Due to inability to excel, the patient decided he needed to detox off of alcohol and drug use.  His work was being affected due to partying and staying up most of the night.  [name] reports he "needs help" and "I can't do it on my own." He stated his girlfriend brought him in for help, drinks 1-2 shots-1 pint liquor daily, last drink last night--smokes 1 blunt marijuana daily, last smoked last night--uses cocaine 3-4 x/week, last use last night and pt stated he uses 1-2 grams when he uses.  Thomas Cobb stated his SA is interfering with his relationship with his girlfriend and children, as well as causing financial stress.  endorses depression and anxiety.  His drug abuse increases when he thinks about his father's drug use and his death in 1993/09/29 of AIDS, decreases when he thinks of his two boys, 31 yo boys (non-twins).  Thomas Cobb stated he has a court date for possession of pills with no label on 01/18/13. He stated he never used the pills, but that his friend gave them to him. Irritable at the beginning of the assessment but then became more interactive and pleasant, fair eye contact, alert and oriented x 3, thought process coherent, answers questions appropriately.  Associated Signs/Synptoms: Depression Symptoms:  depressed mood, fatigue, feelings of worthlessness/guilt, anxiety, disturbed sleep, (Hypo) Manic Symptoms:  Irritable Mood, Anxiety Symptoms:  Excessive Worry, Psychotic Symptoms:  None PTSD Symptoms: NA  Psychiatric Specialty Exam: Physical Exam:  Completed in ED, reviewed, stable  Review of Systems  Constitutional: Negative.   HENT: Negative.   Eyes: Negative.   Respiratory: Negative.   Cardiovascular: Negative.   Gastrointestinal: Negative.   Genitourinary: Negative.    Musculoskeletal: Negative.   Skin: Negative.   Neurological: Negative.   Endo/Heme/Allergies: Negative.   Psychiatric/Behavioral: Positive for depression and substance abuse. The patient is nervous/anxious.     Blood pressure 126/89, pulse 71, temperature 97.9 F (36.6 C), temperature source Oral, resp. rate 18, height 6' (1.829 m), weight 60.782 kg (134 lb).Body mass index is 18.17 kg/(m^2).  General Appearance: Casual  Eye Contact::  Fair  Speech:  Normal Rate  Volume:  Normal  Mood:  Anxious and Depressed  Affect:  Congruent  Thought Process:  Coherent  Orientation:  Full (Time, Place, and Person)  Thought Content:  WDL  Suicidal Thoughts:  No  Homicidal Thoughts:  No  Memory:  Immediate;   Fair Recent;   Fair Remote;   Fair  Judgement:  Poor  Insight:  Fair  Psychomotor Activity:  Decreased  Concentration:  Fair  Recall:  Fair  Akathisia:  No  Handed:  Right  AIMS (if indicated):     Assets:  Communication Skills Physical Health Resilience  Sleep:  Number of Hours: 2.25    Past Psychiatric History: Diagnosis:  Alcohol and drug dependency  Hospitalizations:  None  Outpatient Care:  Substance Abuse Care:  [facility]  Self-Mutilation:  None  Suicidal Attempts:  None  Violent Behaviors:  None   Past Medical History:   Past Medical History  Diagnosis Date  . Depression    None. Allergies:  No Known Allergies PTA Medications: Prescriptions prior to admission  Medication Sig Dispense Refill  . ibuprofen (ADVIL,MOTRIN) 200 MG tablet Take 200 mg by mouth once.        Previous Psychotropic Medications:  Medication/Dose    None   Substance Abuse History in the last 12 months:  yes  Consequences of Substance Abuse: Family Consequences:  Quit job, family strain  Social History:  reports that he has been smoking Cigarettes.  He has a 4.5 pack-year smoking history. He has never used smokeless tobacco. He reports that  drinks alcohol. He reports  that he does not use illicit drugs. Additional Social History: Longest period of sobriety (when/how long): 3 months in 2009  Negative Consequences of Use: Financial;Personal relationships  Current Place of Residence:   Place of Birth:  [place], [place] Family Members:  Mother Marital Status:  Single Children:  Sons:  2  Daughters: Relationships: Education:  Management consultant Problems/Performance:  12th grade Religious Beliefs/Practices:  Christian, does not eat pork History of Abuse (Emotional/Phsycial/Sexual):  None Occupational Experiences;  Fish farm manager History:  None. Legal History:  Past Hobbies/Interests:  Travel, musician--drums, cook  Family History:  History reviewed. No pertinent family history.  Results for orders placed during the hospital encounter of 12/25/12 (from the past 72 hour(s))  CBC WITH DIFFERENTIAL     Status: Abnormal   Collection Time    12/25/12 11:46 AM      Result Value Range   WBC 12.0 (*) 4.0 - 10.5 K/uL   RBC 5.07  4.22 - 5.81 MIL/uL   Hemoglobin 16.0  13.0 - 17.0 g/dL   HCT 16.1  09.6 - 04.5 %   MCV 82.8  78.0 - 100.0 fL   MCH 31.6  26.0 - 34.0 pg   MCHC 38.1 (*) 30.0 - 36.0 g/dL   Comment: RULED OUT INTERFERING SUBSTANCES   RDW 13.2  11.5 - 15.5 %   Platelets 240  150 - 400 K/uL   Neutrophils Relative % 72  43 - 77 %   Neutro Abs 8.7 (*) 1.7 - 7.7 K/uL   Lymphocytes Relative 18  12 - 46 %   Lymphs Abs 2.2  0.7 - 4.0 K/uL   Monocytes Relative 9  3 - 12 %   Monocytes Absolute 1.0  0.1 - 1.0 K/uL   Eosinophils Relative 1  0 - 5 %   Eosinophils Absolute 0.2  0.0 - 0.7 K/uL   Basophils Relative 0  0 - 1 %   Basophils Absolute 0.0  0.0 - 0.1 K/uL  COMPREHENSIVE METABOLIC PANEL     Status: Abnormal   Collection Time    12/25/12 11:46 AM      Result Value Range   Sodium 142  135 - 145 mEq/L   Potassium 3.2 (*) 3.5 - 5.1 mEq/L   Chloride 101  96 - 112 mEq/L   CO2 30  19 - 32 mEq/L   Glucose, Bld 95  70 - 99 mg/dL   BUN 14   6 - 23 mg/dL   Creatinine, Ser 4.09  0.50 - 1.35 mg/dL   Calcium 9.7  8.4 - 81.1 mg/dL   Total Protein 6.9  6.0 - 8.3 g/dL   Albumin 3.9  3.5 - 5.2 g/dL   AST 24  0 - 37 U/L   ALT 12  0 - 53 U/L   Alkaline Phosphatase 50  39 - 117 U/L   Total Bilirubin 0.4  0.3 - 1.2 mg/dL   GFR calc non Af Amer >90  >90 mL/min   GFR calc Af Amer >90  >90 mL/min   Comment: (NOTE)     The eGFR has been calculated using the CKD EPI  equation.     This calculation has not been validated in all clinical situations.     eGFR's persistently <90 mL/min signify possible Chronic Kidney     Disease.  ETHANOL     Status: None   Collection Time    12/25/12 11:46 AM      Result Value Range   Alcohol, Ethyl (B) <11  0 - 11 mg/dL   Comment:            LOWEST DETECTABLE LIMIT FOR     SERUM ALCOHOL IS 11 mg/dL     FOR MEDICAL PURPOSES ONLY  URINE RAPID DRUG SCREEN (HOSP PERFORMED)     Status: Abnormal   Collection Time    12/25/12 12:07 PM      Result Value Range   Opiates NONE DETECTED  NONE DETECTED   Cocaine POSITIVE (*) NONE DETECTED   Benzodiazepines NONE DETECTED  NONE DETECTED   Amphetamines NONE DETECTED  NONE DETECTED   Tetrahydrocannabinol POSITIVE (*) NONE DETECTED   Barbiturates NONE DETECTED  NONE DETECTED   Comment:            DRUG SCREEN FOR MEDICAL PURPOSES     ONLY.  IF CONFIRMATION IS NEEDED     FOR ANY PURPOSE, NOTIFY LAB     WITHIN 5 DAYS.                LOWEST DETECTABLE LIMITS     FOR URINE DRUG SCREEN     Drug Class       Cutoff (ng/mL)     Amphetamine      1000     Barbiturate      200     Benzodiazepine   200     Tricyclics       300     Opiates          300     Cocaine          300     THC              50  URINALYSIS, ROUTINE W REFLEX MICROSCOPIC     Status: Abnormal   Collection Time    12/25/12 12:07 PM      Result Value Range   Color, Urine YELLOW  YELLOW   APPearance CLEAR  CLEAR   Specific Gravity, Urine 1.029  1.005 - 1.030   pH 6.5  5.0 - 8.0   Glucose, UA  NEGATIVE  NEGATIVE mg/dL   Hgb urine dipstick NEGATIVE  NEGATIVE   Bilirubin Urine SMALL (*) NEGATIVE   Ketones, ur 15 (*) NEGATIVE mg/dL   Protein, ur 409 (*) NEGATIVE mg/dL   Urobilinogen, UA 1.0  0.0 - 1.0 mg/dL   Nitrite NEGATIVE  NEGATIVE   Leukocytes, UA NEGATIVE  NEGATIVE  URINE MICROSCOPIC-ADD ON     Status: None   Collection Time    12/25/12 12:07 PM      Result Value Range   Squamous Epithelial / LPF RARE  RARE   WBC, UA 3-6  <3 WBC/hpf   RBC / HPF 0-2  <3 RBC/hpf   Bacteria, UA RARE  RARE   Sperm, UA PRESENT     Psychological Evaluations:  Assessment:   AXIS I:  Alcohol Abuse, Anxiety Disorder NOS, Substance Abuse and Substance Induced Mood Disorder AXIS II:  Deferred AXIS III:   Past Medical History  Diagnosis Date  . Depression    AXIS IV:  occupational problems, other psychosocial or  environmental problems, problems related to social environment and problems with primary support group AXIS V:  41-50 serious symptoms  Treatment Plan/Recommendations:  Plan:  Review of chart, vital signs, medications, and notes. 1-Admit for crisis management and stabilization.  Estimated length of stay 5-7 days past his current stay of 0 2-Individual and group therapy encouraged 3-Medication management for depression, alcohol detox, and anxiety to reduce current symptoms to base line and improve the patient's overall level of functioning:  Medications reviewed with the patient and he stated no untoward effects, Librium protocol started, eye medication started for his conjunctivitis  4-Coping skills for depression, substance abuse, and anxiety developing-- 5-Continue crisis stabilization and management 6-Address health issues--monitoring vital signs, stable 7-Treatment plan in progress to prevent relapse of depression, substance abuse, and anxiety 8-Psychosocial education regarding relapse prevention and self-care 8-Health care follow up as needed for any health concerns 9-Call for  consult with hospitalist for additional specialty patient services as needed.  Treatment Plan Summary: Daily contact with patient to assess and evaluate symptoms and progress in treatment Medication management Current Medications:  Current Facility-Administered Medications  Medication Dose Route Frequency Provider Last Rate Last Dose  . acetaminophen (TYLENOL) tablet 650 mg  650 mg Oral Q6H PRN Court Joy, PA-C      . alum & mag hydroxide-simeth (MAALOX/MYLANTA) 200-200-20 MG/5ML suspension 30 mL  30 mL Oral Q4H PRN Court Joy, PA-C      . chlordiazePOXIDE (LIBRIUM) capsule 25 mg  25 mg Oral Q6H PRN Nanine Means, NP      . chlordiazePOXIDE (LIBRIUM) capsule 25 mg  25 mg Oral QID Nanine Means, NP       Followed by  . [START ON 12/27/2012] chlordiazePOXIDE (LIBRIUM) capsule 25 mg  25 mg Oral TID Nanine Means, NP       Followed by  . [START ON 12/28/2012] chlordiazePOXIDE (LIBRIUM) capsule 25 mg  25 mg Oral BH-qamhs Nanine Means, NP       Followed by  . [START ON 12/30/2012] chlordiazePOXIDE (LIBRIUM) capsule 25 mg  25 mg Oral Daily Nanine Means, NP      . hydrOXYzine (ATARAX/VISTARIL) tablet 25 mg  25 mg Oral Q6H PRN Nanine Means, NP      . loperamide (IMODIUM) capsule 2-4 mg  2-4 mg Oral PRN Nanine Means, NP      . magnesium hydroxide (MILK OF MAGNESIA) suspension 30 mL  30 mL Oral Daily PRN Court Joy, PA-C      . multivitamin with minerals tablet 1 tablet  1 tablet Oral Daily Nanine Means, NP      . ondansetron (ZOFRAN-ODT) disintegrating tablet 4 mg  4 mg Oral Q6H PRN Nanine Means, NP      . thiamine (B-1) injection 100 mg  100 mg Intramuscular Once Nanine Means, NP      . Melene Muller ON 12/27/2012] thiamine (VITAMIN B-1) tablet 100 mg  100 mg Oral Daily Nanine Means, NP      . traZODone (DESYREL) tablet 50 mg  50 mg Oral QHS PRN,MR X 1 Court Joy, PA-C        Observation Level/Precautions:  15 minute checks  Laboratory:  Completed in ED, reviewed, stable  Psychotherapy:   Individual and group therapy  Medications:  Librium protocol  Consultations:  None  Discharge Concerns:  Rehab    Estimated LOS:   5-7 days  Other:     I certify that inpatient services furnished can reasonably be expected to improve the  patient's condition.   Nanine Means, PMH-NP 8/16/201410:06 AM

## 2012-12-26 NOTE — Progress Notes (Signed)
Psychoeducational Group Note  Date:  12/26/2012 Time:  0945 am  Group Topic/Focus:  Identifying Needs:   The focus of this group is to help patients identify their personal needs that have been historically problematic and identify healthy behaviors to address their needs.  Participation Level:  Did Not Attend  Andrena Mews 12/26/2012,9:33 AM
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
## 2012-12-26 NOTE — Tx Team (Signed)
Initial Interdisciplinary Treatment Plan  PATIENT STRENGTHS: (choose at least two) Ability for insight Average or above average intelligence Communication skills General fund of knowledge Supportive family/friends  PATIENT STRESSORS: Financial difficulties Occupational concerns Substance abuse   PROBLEM LIST: Problem List/Patient Goals Date to be addressed Date deferred Reason deferred Estimated date of resolution  Substance abuse 12/26/2012                                                      DISCHARGE CRITERIA:  Ability to meet basic life and health needs Improved stabilization in mood, thinking, and/or behavior Verbal commitment to aftercare and medication compliance  PRELIMINARY DISCHARGE PLAN: Attend aftercare/continuing care group Outpatient therapy Return to previous living arrangement  PATIENT/FAMIILY INVOLVEMENT: This treatment plan has been presented to and reviewed with the patient, Thomas Cobb, and/or family member.  The patient and family have been given the opportunity to ask questions and make suggestions.  Leda Quail T 12/26/2012, 5:06 AM

## 2012-12-26 NOTE — Progress Notes (Signed)
Patient ID: Thomas Cobb, male   DOB: 12-Sep-1980, 32 y.o.   MRN: [id number] Pt is a 32 year old male admitted voluntarily for ETOH detox and cocaine abuse. Pt states that he drinks about 1/2 a pint a day and uses cocaine 3-4x a week. He also reports daily marijuana use. UDS positive for cocaine and THC. Pt states that he has had increasing depression. When asked what made the patient seek treatment, pt stated " I began to see myself decline" "I was spending a lot of unnecessary money" "I wasn't stable". Pt has been to rehab a couple times before. He states that his longest period of sobriety was for 3 months in 2009. He has been using cocaine since the age of 59. Pt seems to have inadequate nutrition and is underweight for his height of 6 feet. He currently denies any SI/HI/AVH. Pt belongings searched and placed in locker. Body and skin assessed visually. Pt explained the treatment agreement and verbalized understanding. No complaints of pain or discomfort at this time. Q15 min checks maintained for safety. Will continue to monitor pt.

## 2012-12-26 NOTE — Progress Notes (Signed)
Patient did attend the evening speaker AA meeting.  

## 2012-12-26 NOTE — [facility] Suicide Risk Assessment (Signed)
Suicide Risk Assessment  Admission Assessment     Nursing information obtained from:  Patient Demographic factors:  Male Current Mental Status:  NA no si, no hi, no avh Loss Factors:  Financial problems / change in socioeconomic status Historical Factors:  NA,  Risk Reduction Factors:  Responsible for children under 31 years of age;Sense of responsibility to family  CLINICAL FACTORS:   Alcohol/Substance Abuse/Dependencies  COGNITIVE FEATURES THAT CONTRIBUTE TO RISK:  Closed-mindedness    SUICIDE RISK:   Minimal: No identifiable suicidal ideation.  Patients presenting with no risk factors but with morbid ruminations; may be classified as minimal risk based on the severity of the depressive symptoms  PLAN OF CARE: Continue current meds  I certify that inpatient services furnished can reasonably be expected to improve the patient's condition.  Wonda Cerise 12/26/2012, 7:04 PM

## 2012-12-26 NOTE — ED Provider Notes (Addendum)
Medical screening examination/treatment/procedure(s) were performed by non-physician practitioner and as supervising physician I was immediately available for consultation/collaboration.    Claudean Kinds, MD 12/26/12 1107  Claudean Kinds, MD 01/06/13 1106

## 2012-12-26 NOTE — Progress Notes (Signed)
D-Patient is quiet and guarded. C/o left eye discomfort which was reported to provider. Administered prescribed medications. Refused patient inventory sheet. Denies SI. Attended afternoon group with active participation.  A- Support and encouragement given. Continue current POC and evaluation of treatment goals. Continue 15' checks for safety.  R- Remains safe.

## 2012-12-26 NOTE — [facility] Group Notes (Signed)
[facility] Group Notes: (Clinical Social Work)   12/26/2012      Type of Therapy:  Group Therapy   Participation Level:  Did Not Attend    Ambrose Mantle, LCSW 12/26/2012, 1:12 PM

## 2012-12-27 DIAGNOSIS — F1994 Other psychoactive substance use, unspecified with psychoactive substance-induced mood disorder: Secondary | ICD-10-CM

## 2012-12-27 DIAGNOSIS — F1910 Other psychoactive substance abuse, uncomplicated: Secondary | ICD-10-CM

## 2012-12-27 DIAGNOSIS — F1010 Alcohol abuse, uncomplicated: Secondary | ICD-10-CM

## 2012-12-27 DIAGNOSIS — F411 Generalized anxiety disorder: Secondary | ICD-10-CM

## 2012-12-27 NOTE — Progress Notes (Signed)
Patient did attend the evening speaker AA meeting.  

## 2012-12-27 NOTE — Progress Notes (Signed)
D: Pt seems to be in a better mood tonight. He is pleasant and smiles upon approach. Pt has increased interaction among peers and attended tonight's group therapy. He listened attentively to positive suggestions from RN and seems to be vested in treatment.  A: Support given. Verbalization encouraged. Pt encouraged to try to attend all groups tomorrow. Pt also encouraged to come to nurse/staff with any concerns. R: Pt is receptive. No complaints of pain or discomfort at this time. Q15 min monitoring maintained for safety. Will continue to monitor pt.

## 2012-12-27 NOTE — Progress Notes (Signed)
[facility] MD Progress Note  12/27/2012 1:33 PM Thomas Cobb  MRN:  [id number] Subjective:  32 y/o AA male admitted to [facility] for alcohol and substance abuse.  He states that he is depressed today moreso following conversation with his mother.  He denies SI/HI of AV hallucinations.  He is talkative during assessment and reviews stressors with Clinical research associate concerns as it relates to "staying clean".  He expressed that things are looking up for him.  He is sleeping well and his appetite is good.  He reports that "eye symptoms" ie: pruritis mucus has decreased since he began antibiotic drops on yesterday.   Diagnosis:    AXIS I: Alcohol Abuse, Anxiety Disorder NOS, Substance Abuse and Substance Induced Mood Disorder  AXIS II: Deferred  AXIS III:  Past Medical History   Diagnosis  Date   .  Depression    AXIS IV: occupational problems, other psychosocial or environmental problems, problems related to social environment and problems with primary support group  AXIS V: 41-50 serious symptoms   ADL's:  Intact  Sleep: Good  Appetite:  Good  Suicidal Ideation:  No Homicidal Ideation:  No AEB (as evidenced by):  Psychiatric Specialty Exam: Completed in ED; reviewed, stable  Review of Systems  Constitutional: Negative.   HENT: Negative.   Eyes: Positive for discharge.  Respiratory: Negative.   Cardiovascular: Negative.   Gastrointestinal: Negative.   Genitourinary: Negative.   Musculoskeletal: Negative.   Skin: Negative.   Neurological: Negative.   Endo/Heme/Allergies: Negative.   Psychiatric/Behavioral: Positive for depression and substance abuse. The patient is nervous/anxious.     Blood pressure 121/79, pulse 66, temperature 97.9 F (36.6 C), temperature source Oral, resp. rate 17, height 6' (1.829 m), weight 60.782 kg (134 lb).Body mass index is 18.17 kg/(m^2).  General Appearance: Negative  Eye Contact::  Good  Speech:  Clear and Coherent and Normal Rate  Volume:  Normal  Mood:   Anxious and Depressed  Affect:  Appropriate  Thought Process:  Negative  Orientation:  Full (Time, Place, and Person)  Thought Content:  WDL  Suicidal Thoughts:  No  Homicidal Thoughts:  No  Memory:  Immediate;   Good  Judgement:  Good  Insight:  Fair  Psychomotor Activity:  Negative  Concentration:  Fair  Recall:  Good  Akathisia:  Negative  Handed:  Right  AIMS (if indicated):     Assets:  Communication Skills Desire for Improvement Housing Physical Health Social Support  Sleep:  Number of Hours: 6.25   Current Medications: Current Facility-Administered Medications  Medication Dose Route Frequency Provider Last Rate Last Dose  . acetaminophen (TYLENOL) tablet 650 mg  650 mg Oral Q6H PRN Court Joy, PA-C      . alum & mag hydroxide-simeth (MAALOX/MYLANTA) 200-200-20 MG/5ML suspension 30 mL  30 mL Oral Q4H PRN Court Joy, PA-C      . chlordiazePOXIDE (LIBRIUM) capsule 25 mg  25 mg Oral Q6H PRN Nanine Means, NP      . chlordiazePOXIDE (LIBRIUM) capsule 25 mg  25 mg Oral TID Nanine Means, NP   25 mg at 12/27/12 0820   Followed by  . [START ON 12/28/2012] chlordiazePOXIDE (LIBRIUM) capsule 25 mg  25 mg Oral BH-qamhs Nanine Means, NP       Followed by  . [START ON 12/29/2012] chlordiazePOXIDE (LIBRIUM) capsule 25 mg  25 mg Oral Daily Nanine Means, NP      . hydrOXYzine (ATARAX/VISTARIL) tablet 25 mg  25 mg Oral Q6H PRN  Nanine Means, NP      . loperamide (IMODIUM) capsule 2-4 mg  2-4 mg Oral PRN Nanine Means, NP      . magnesium hydroxide (MILK OF MAGNESIA) suspension 30 mL  30 mL Oral Daily PRN Court Joy, PA-C      . menthol-cetylpyridinium (CEPACOL) lozenge 3 mg  1 lozenge Oral PRN Nanine Means, NP      . multivitamin with minerals tablet 1 tablet  1 tablet Oral Daily Nanine Means, NP   1 tablet at 12/27/12 0820  . neomycin-polymyxin-gramicidin (NEOSPORIN) 1.75-10000-.025 ophthalmic solution 1 drop  1 drop Left Eye QID Nanine Means, NP   1 drop at 12/27/12 0820  .  ondansetron (ZOFRAN-ODT) disintegrating tablet 4 mg  4 mg Oral Q6H PRN Nanine Means, NP      . potassium chloride (K-DUR,KLOR-CON) CR tablet 10 mEq  10 mEq Oral BID Nanine Means, NP   10 mEq at 12/27/12 0820  . thiamine (B-1) injection 100 mg  100 mg Intramuscular Once Nanine Means, NP      . thiamine (VITAMIN B-1) tablet 100 mg  100 mg Oral Daily Nanine Means, NP   100 mg at 12/27/12 0820  . traZODone (DESYREL) tablet 50 mg  50 mg Oral QHS PRN,MR X 1 Court Joy, PA-C   50 mg at 12/26/12 2139    Lab Results: No results found for this or any previous visit (from the past 48 hour(s)).  Physical Findings: AIMS: Facial and Oral Movements Muscles of Facial Expression: None, normal Lips and Perioral Area: None, normal Jaw: None, normal Tongue: None, normal,Extremity Movements Upper (arms, wrists, hands, fingers): None, normal Lower (legs, knees, ankles, toes): None, normal, Trunk Movements Neck, shoulders, hips: None, normal, Overall Severity Severity of abnormal movements (highest score from questions above): None, normal Incapacitation due to abnormal movements: None, normal Patient's awareness of abnormal movements (rate only patient's report): No Awareness, Dental Status Current problems with teeth and/or dentures?: No Does patient usually wear dentures?: No  CIWA:  CIWA-Ar Total: 0 COWS:  COWS Total Score: 0  Treatment Plan Summary: Daily contact with patient to assess and evaluate symptoms and progress in treatment Medication management  Plan: Treatment Plan/Recommendations: Plan: Review of chart, vital signs, medications, and notes.  1-Admit for crisis management and stabilization. Estimated length of stay 5-7 days 2-Individual and group therapy encouraged  3-Medication management for depression, alcohol detox, and anxiety to reduce current symptoms to base line and improve the patient's overall level of functioning: Medications reviewed with the patient and he stated no  untoward effects. Continue medication for his conjunctivitis   4-Coping skills for depression, substance abuse, and anxiety developing  5-Continue crisis stabilization and management  6-Address health issues--monitoring vital signs, stable  7-Treatment plan in progress to prevent relapse of depression, substance abuse, and anxiety   Medical Decision Making Problem Points:  Established problem, stable/improving (1) Data Points:  Review of new medications or change in dosage (2) Review or order of Psychological tests (1)  I certify that inpatient services furnished can reasonably be expected to improve the patient's condition.   Kizzie Fantasia CORI FNP-C 12/27/2012, 1:33 PM

## 2012-12-27 NOTE — [facility] Group Notes (Signed)
[facility] Group Notes: (Clinical Social Work)   12/27/2012      Type of Therapy:  Group Therapy   Participation Level:  Did Not Attend    Ambrose Mantle, LCSW 12/27/2012, 1:53 PM

## 2012-12-27 NOTE — Progress Notes (Signed)
Psychoeducational Group Note  Date:  12/27/2012 Time:  0945 am  Group Topic/Focus:  Making Healthy Choices:   The focus of this group is to help patients identify negative/unhealthy choices they were using prior to admission and identify positive/healthier coping strategies to replace them upon discharge.  Participation Level:  Did Not Attend  Karem Tomaso J 12/27/2012, 10:29 AM 

## 2012-12-27 NOTE — [facility] Group Notes (Signed)
[facility] Group Notes:  (Nursing/MHT/Case Management/Adjunct)  Date:  12/27/2012  Time:  3:02 PM  Type of Therapy:  Nurse Education  Participation Level:  Minimal  Participation Quality:  Appropriate  Affect:  Appropriate  Cognitive:  Oriented  Insight:  Improving  Engagement in Group:  Engaged  Modes of Intervention:  Problem-solving  Summary of Progress/Problems:  Thomas Cobb 12/27/2012, 3:02 PM

## 2012-12-27 NOTE — Progress Notes (Signed)
Adult Psychoeducational Group Note  Date:  12/27/2012 Time:  5:24 PM  Group Topic/Focus:  Making Healthy Choices:   The focus of this group is to help patients identify negative/unhealthy choices they were using prior to admission and identify positive/healthier coping strategies to replace them upon discharge.  Participation Level:  Active  Participation Quality:  Appropriate and Attentive  Affect:  Appropriate  Cognitive:  Alert and Appropriate  Insight: Appropriate  Engagement in Group:  Engaged  Modes of Intervention:  Activity and Socialization  Additional Comments:    Thomas Cobb 12/27/2012, 5:24 PM

## 2012-12-28 DIAGNOSIS — F1410 Cocaine abuse, uncomplicated: Secondary | ICD-10-CM

## 2012-12-28 DIAGNOSIS — F4310 Post-traumatic stress disorder, unspecified: Secondary | ICD-10-CM

## 2012-12-28 DIAGNOSIS — F1020 Alcohol dependence, uncomplicated: Principal | ICD-10-CM

## 2012-12-28 MED ORDER — ENSURE COMPLETE PO LIQD
237.0000 mL | Freq: Four times a day (QID) | ORAL | Status: DC
  Administered 2012-12-28 – 2012-12-30 (×10): 237 mL via ORAL

## 2012-12-28 NOTE — Progress Notes (Signed)
D: Patient in the hall way during this assessment. He appeared somewhere frustrated about treatment here at [facility]. Patient stated; " I'm tired about people asking me the same question over and over, I talked to the doctor today and let him know all about my depression and the reason why I'm depressed, you know what? You all start to depress me". He said the first three days he got here he was getting medications for detox. A: Writer encouraged and supported patient. Explained the importance of alcohol detox to patient. R: Patient receptive to encouragement and support. Q 15 minute check continues as ordered to maintain safety.

## 2012-12-28 NOTE — Progress Notes (Addendum)
Patient ID: Thomas Cobb, male   DOB: 01-Apr-1981, 32 y.o.   MRN: [id number] D: Pt is awake and active on the unit this AM. Pt denies SI/HI and A/V hallucinations. Pt mood and affect are appropriate. Pt is primarily concerned with STD testing which has been addressed. Pt is also participating in the milieu and interacting well with staff. Pt is also interested in anti-depressant medication and tx for PTSD. Writer encouraged pt to discuss his options with the psychiatrist, and to also participate in a recovery program in order to maintain his sobriety and continue his progress.   A: Encouraged pt to discuss feelings with staff and administered medication per MD orders. Writer also encouraged pt to participate in groups.  R: Pt is attending groups and tolerating medications well. Writer will continue to monitor. 15 minute checks are ongoing for safety.

## 2012-12-28 NOTE — Progress Notes (Signed)
[facility] MD Progress Note  12/28/2012 3:06 PM Thomas Cobb  MRN:  [id number] Subjective:  Thomas Cobb is dealing with all the traumatic events he has been trough in his life. He is still dealing with it. Still with flashbacks, thoughts, nightmares. He is wanting to get his life back together for is and his kids sake. He is wanting to work things out with his girlfriend who he says has been very supportive. He is committed to abstinence. He states that he has seen himself do some of the same things that his father did and he does not want to repeat the same mistakes. Diagnosis:  Alcohol Dependence, Cocaine Abuse, PTSD  ADL's:  Intact  Sleep: Poor  Appetite:  Poor  Suicidal Ideation:  Plan:  denies Intent:  denies Means:  denies Homicidal Ideation:  Plan:  denies Intent:  denies Means:  denies AEB (as evidenced by):  Psychiatric Specialty Exam: Review of Systems  Constitutional: Negative.   HENT: Negative.   Eyes: Negative.   Respiratory: Negative.   Cardiovascular: Negative.   Gastrointestinal: Negative.   Genitourinary: Negative.   Musculoskeletal: Negative.   Skin: Negative.   Neurological: Negative.   Endo/Heme/Allergies: Negative.   Psychiatric/Behavioral: Positive for depression and substance abuse. The patient is nervous/anxious and has insomnia.     Blood pressure 118/77, pulse 70, temperature 97.6 F (36.4 C), temperature source Oral, resp. rate 16, height 6' (1.829 m), weight 60.782 kg (134 lb).Body mass index is 18.17 kg/(m^2).  General Appearance: Fairly Groomed  Patent attorney::  Fair  Speech:  Clear and Coherent  Volume:  fluctuates  Mood:  Anxious, Depressed and worried  Affect:  Tearful and anxious, sad, worried  Thought Process:  Coherent and Goal Directed  Orientation:  Full (Time, Place, and Person)  Thought Content:  Rumination and traumaitc events he went trough, symptoms, worries, concerns  Suicidal Thoughts:  No  Homicidal Thoughts:  No  Memory:   Immediate;   Fair Recent;   Fair Remote;   Fair  Judgement:  Fair  Insight:  Shallow  Psychomotor Activity:  Restlessness  Concentration:  Fair  Recall:  Fair  Akathisia:  No  Handed:  Right  AIMS (if indicated):     Assets:  Desire for Improvement  Sleep:  Number of Hours: 5.75   Current Medications: Current Facility-Administered Medications  Medication Dose Route Frequency Provider Last Rate Last Dose  . acetaminophen (TYLENOL) tablet 650 mg  650 mg Oral Q6H PRN Court Joy, PA-C      . alum & mag hydroxide-simeth (MAALOX/MYLANTA) 200-200-20 MG/5ML suspension 30 mL  30 mL Oral Q4H PRN Court Joy, PA-C      . chlordiazePOXIDE (LIBRIUM) capsule 25 mg  25 mg Oral Q6H PRN Nanine Means, NP      . chlordiazePOXIDE (LIBRIUM) capsule 25 mg  25 mg Oral BH-qamhs Nanine Means, NP       Followed by  . [START ON 12/29/2012] chlordiazePOXIDE (LIBRIUM) capsule 25 mg  25 mg Oral Daily Nanine Means, NP      . feeding supplement (ENSURE COMPLETE) liquid 237 mL  237 mL Oral QID Earna Coder, RD   237 mL at 12/28/12 1157  . hydrOXYzine (ATARAX/VISTARIL) tablet 25 mg  25 mg Oral Q6H PRN Nanine Means, NP      . loperamide (IMODIUM) capsule 2-4 mg  2-4 mg Oral PRN Nanine Means, NP      . magnesium hydroxide (MILK OF MAGNESIA) suspension 30 mL  30 mL  Oral Daily PRN Court Joy, PA-C      . menthol-cetylpyridinium (CEPACOL) lozenge 3 mg  1 lozenge Oral PRN Nanine Means, NP      . multivitamin with minerals tablet 1 tablet  1 tablet Oral Daily Nanine Means, NP   1 tablet at 12/28/12 0829  . neomycin-polymyxin-gramicidin (NEOSPORIN) 1.75-10000-.025 ophthalmic solution 1 drop  1 drop Left Eye QID Nanine Means, NP   1 drop at 12/28/12 1156  . ondansetron (ZOFRAN-ODT) disintegrating tablet 4 mg  4 mg Oral Q6H PRN Nanine Means, NP      . potassium chloride (K-DUR,KLOR-CON) CR tablet 10 mEq  10 mEq Oral BID Nanine Means, NP   10 mEq at 12/28/12 0830  . thiamine (B-1) injection 100 mg  100 mg  Intramuscular Once Nanine Means, NP      . thiamine (VITAMIN B-1) tablet 100 mg  100 mg Oral Daily Nanine Means, NP   100 mg at 12/28/12 1157  . traZODone (DESYREL) tablet 50 mg  50 mg Oral QHS PRN,MR X 1 Court Joy, PA-C   50 mg at 12/27/12 2244    Lab Results: No results found for this or any previous visit (from the past 48 hour(s)).  Physical Findings: AIMS: Facial and Oral Movements Muscles of Facial Expression: None, normal Lips and Perioral Area: None, normal Jaw: None, normal Tongue: None, normal,Extremity Movements Upper (arms, wrists, hands, fingers): None, normal Lower (legs, knees, ankles, toes): None, normal, Trunk Movements Neck, shoulders, hips: None, normal, Overall Severity Severity of abnormal movements (highest score from questions above): None, normal Incapacitation due to abnormal movements: None, normal Patient's awareness of abnormal movements (rate only patient's report): No Awareness, Dental Status Current problems with teeth and/or dentures?: No Does patient usually wear dentures?: No  CIWA:  CIWA-Ar Total: 0 COWS:  COWS Total Score: 0  Treatment Plan Summary: Daily contact with patient to assess and evaluate symptoms and progress in treatment Medication management  Plan: Supportive approach/coping skills/relapse prevention           CBT/Mindfullness           Trial with Zoloft 50 mg daily               Medical Decision Making Problem Points:  Review of psycho-social stressors (1) Data Points:  Review of medication regiment & side effects (2)  I certify that inpatient services furnished can reasonably be expected to improve the patient's condition.   Doloras Tellado A 12/28/2012, 3:06 PM

## 2012-12-28 NOTE — Progress Notes (Signed)
Adult Psychoeducational Group Note  Date:  12/28/2012 Time: 11:00AM Group Topic/Focus:  Wellness Toolbox:   The focus of this group is to discuss various aspects of wellness, balancing those aspects and exploring ways to increase the ability to experience wellness.  Patients will create a wellness toolbox for use upon discharge.  Participation Level:  Active  Participation Quality:  Appropriate and Attentive  Affect:  Appropriate  Cognitive:  Alert and Appropriate  Insight: Appropriate  Engagement in Group:  Engaged  Modes of Intervention:  Discussion  Additional Comments:  Pt. Was attentive and appropriate during today's group discussion. Pt was able to complete self care assessment. Pt. Stated that he need to improve on emotional self care. Pt shared how at times he has a negative attitude and behavior towards family members and support system. Pt shared that he is not where he use to be and not where he wanna be. Pt. Stated that he changing his negative behaviors into positive.   Bing Plume D 12/28/2012, 2:24 PM

## 2012-12-28 NOTE — [facility] Group Notes (Signed)
[facility] LCSW Group Therapy  12/28/2012 3:28 PM  Type of Therapy:  Group Therapy  Participation Level:  Minimal  Participation Quality:  Attentive  Affect:  Depressed  Cognitive:  Alert  Insight:  Engaged  Engagement in Therapy:  Engaged  Modes of Intervention:  Discussion, Education, Exploration, Socialization and Support  Summary of Progress/Problems: Today's Topic: Overcoming Obstacles. Pt identified obstacles faced currently and processed barriers involved in overcoming these obstacles. Pt identified steps necessary for overcoming these obstacles and explored motivation (internal and external) for facing these difficulties head on. Pt further identified one area of concern in their lives and chose a skill of focus pulled from their "toolbox." Trayson was attentive throughout group but did not actively participate in group conversation. When called upon by CSW, he stated that his obstacle was similar to another patient in that he cannot decide which "road to take" in terms of aftercare. He stated taht he is trying to figure out which option would work best for him but feels overwhelmed and unsure about the future. Kolter was offered support and encouragement by other group members. He shows some progress in the group setting AEB his ability to share his problem and accept advice from others. Mani demonstrates limited insight AEB his inability to come up with coping skills or ways to address this obstacle. CSW suggested that he make a pros cons list/learn more about each facility/talk it over with supportive people in his life.    Smart, Suzane Vanderweide 12/28/2012, 3:28 PM

## 2012-12-28 NOTE — Progress Notes (Signed)
Nutrition Brief Note Patient identified on the Malnutrition Screening Tool (MST) Report.  Wt Readings from Last 10 Encounters:  12/26/12 134 lb (60.782 kg)   Body mass index is 18.17 kg/(m^2). Patient meets criteria for underweight based on current BMI.   Discussed intake PTA with patient and compared to intake presently.  Discussed changes in intake, if any, and encouraged adequate intake of meals and snacks. Current diet order is regular and pt is also offered choice of unit snacks mid-morning and mid-afternoon.  Pt is eating as desired.   Labs and medications reviewed.   Pt reports that he knows he has lost weight, but that since he has been using cocaine, he has never been above 145 lbs. He says that he would be happiest at 150 lbs. He says that he eats fine on the days that he is clean, but that on the days he is using, that he does not eat anything. He wanted information on how to gain weight. He was recommended to use whole milk and carnation instant breakfast essentials once he is discharged. He was referred to apply for [facility] and to use the food bank until he can find a new job.   Nutrition Dx:  Unintended wt change r/t suboptimal oral intake AEB pt report  Interventions:   Discussed the importance of nutrition and encouraged intake of food and beverages.     Discussed weight goals with patient.   Supplements: Ensure Complete po QID, each supplement provides 350 kcal and 13 grams of protein.     Referred pt to use food bank and to apply for SNAP until he can find a job.  No additional nutrition interventions warranted at this time. If nutrition issues arise, please consult RD.   Ebbie Latus RD, LDN
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
## 2012-12-28 NOTE — [facility] Group Notes (Signed)
[facility] LCSW Aftercare Discharge Planning Group Note   12/28/2012 10:42 AM  Participation Quality:  Appropriate   Mood/Affect:  Anxious  Depression Rating:  8  Anxiety Rating:  7  Thoughts of Suicide:  No Will you contract for safety?   NA  Current AVH:  No  Plan for Discharge/Comments:  Pt interested in [facility] and [facility]. He lives with his girlfriend currently and stated that it was his idea to come to [facility] for treatment for ETOH detox and substance abuse (cocaine).   Transportation Means: unknown at this time.  Supports: girlfriend/some family members  Smart, Research scientist (physical sciences)

## 2012-12-29 LAB — RPR: RPR Ser Ql: NONREACTIVE

## 2012-12-29 LAB — HIV ANTIBODY (ROUTINE TESTING W REFLEX): HIV: NONREACTIVE

## 2012-12-29 MED ORDER — MIRTAZAPINE 30 MG PO TABS
30.0000 mg | ORAL_TABLET | Freq: Every day | ORAL | Status: DC
  Administered 2012-12-29: 30 mg via ORAL
  Filled 2012-12-29: qty 1
  Filled 2012-12-29: qty 14
  Filled 2012-12-29 (×2): qty 1

## 2012-12-29 MED ORDER — SERTRALINE HCL 25 MG PO TABS
25.0000 mg | ORAL_TABLET | Freq: Every day | ORAL | Status: DC
  Administered 2012-12-29 – 2012-12-30 (×2): 25 mg via ORAL
  Filled 2012-12-29 (×2): qty 1
  Filled 2012-12-29: qty 14
  Filled 2012-12-29 (×2): qty 1

## 2012-12-29 NOTE — Progress Notes (Signed)
Adult Psychoeducational Group Note  Date:  12/29/2012 Time:  9:58 PM  Group Topic/Focus:  Identifying Needs:   The focus of this group is to help patients identify their personal needs that have been historically problematic and identify healthy behaviors to address their needs.  Participation Level:  Active  Participation Quality:  Appropriate, Sharing and Supportive  Affect:  Appropriate  Cognitive:  Appropriate and Oriented  Insight: Appropriate and Good  Engagement in Group:  Engaged and Supportive  Modes of Intervention:  Discussion, Problem-solving and Support  Additional Comments:  [name] shared his card with the group and stated that his card Courage reminded him to have the courage to want to continue to get better and trust that things will get better.    [name] 12/29/2012, 9:58 PM
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
## 2012-12-29 NOTE — Progress Notes (Signed)
[name] MD Progress Note  12/29/2012 4:38 PM Thomas Cobb  MRN:  [id number] Subjective:  Thomas Cobb endorses he is having a hard time. He is having conflict with his girlfriend who does not trust him and is trying to find more information about the women he has seen. States that this just reminds him of the way his father used to be. He does not think he can go and stay with her. Feels little support for him out there. He took the sleeping pill last night. States he had nightmares and continues to be falling asleep and having the dreams during the day. Willing to try the antidepressant. When he had the argument with his girlfriend over the phone, felt he was going to "pass out."  Diagnosis:  Alcohol Dependence, Cocaine Abuse, PTSD  ADL's:  Intact  Sleep: Poor  Appetite:  Poor  Suicidal Ideation:  Plan:  denies Intent:  denies Means:  denies Homicidal Ideation:  Plan:  denies Intent:  denies Means:  denies AEB (as evidenced by):  Psychiatric Specialty Exam: Review of Systems  Constitutional: Negative.   HENT: Negative.   Eyes: Negative.   Respiratory: Negative.   Cardiovascular: Negative.   Gastrointestinal: Negative.   Genitourinary: Negative.   Musculoskeletal: Negative.   Skin: Negative.   Neurological: Negative.   Endo/Heme/Allergies: Negative.   Psychiatric/Behavioral: Positive for depression and substance abuse. The patient is nervous/anxious and has insomnia.     Blood pressure 115/77, pulse 71, temperature 97.9 F (36.6 C), temperature source Oral, resp. rate 16, height 6' (1.829 m), weight 60.782 kg (134 lb).Body mass index is 18.17 kg/(m^2).  General Appearance: Disheveled  Eye Solicitor::  Fair  Speech:  Clear and Coherent and Slow  Volume:  Decreased  Mood:  Anxious  Affect:  Restricted  Thought Process:  Coherent and Goal Directed  Orientation:  Full (Time, Place, and Person)  Thought Content:  worries, concerns issues of shame and guilt for his behavior   Suicidal Thoughts:  No  Homicidal Thoughts:  No  Memory:  Immediate;   Fair Recent;   Fair Remote;   Fair  Judgement:  Fair  Insight:  Present  Psychomotor Activity:  Restlessness  Concentration:  Fair  Recall:  Fair  Akathisia:  No  Handed:  Right  AIMS (if indicated):     Assets:  Desire for Improvement  Sleep:  Number of Hours: 6.25   Current Medications: Current Facility-Administered Medications  Medication Dose Route Frequency Provider Last Rate Last Dose  . acetaminophen (TYLENOL) tablet 650 mg  650 mg Oral Q6H PRN Court Joy, PA-C      . alum & mag hydroxide-simeth (MAALOX/MYLANTA) 200-200-20 MG/5ML suspension 30 mL  30 mL Oral Q4H PRN Court Joy, PA-C      . chlordiazePOXIDE (LIBRIUM) capsule 25 mg  25 mg Oral Daily Nanine Means, NP      . feeding supplement (ENSURE COMPLETE) liquid 237 mL  237 mL Oral QID Earna Coder, RD   237 mL at 12/29/12 1416  . magnesium hydroxide (MILK OF MAGNESIA) suspension 30 mL  30 mL Oral Daily PRN Court Joy, PA-C      . menthol-cetylpyridinium (CEPACOL) lozenge 3 mg  1 lozenge Oral PRN Nanine Means, NP      . multivitamin with minerals tablet 1 tablet  1 tablet Oral Daily Nanine Means, NP   1 tablet at 12/29/12 0802  . neomycin-polymyxin-gramicidin (NEOSPORIN) 1.75-10000-.025 ophthalmic solution 1 drop  1 drop Left Eye QID  Nanine Means, NP   1 drop at 12/29/12 1201  . sertraline (ZOLOFT) tablet 25 mg  25 mg Oral Daily Rachael Fee, MD   25 mg at 12/29/12 1201  . thiamine (B-1) injection 100 mg  100 mg Intramuscular Once Nanine Means, NP      . thiamine (VITAMIN B-1) tablet 100 mg  100 mg Oral Daily Nanine Means, NP   100 mg at 12/29/12 0802  . traZODone (DESYREL) tablet 50 mg  50 mg Oral QHS PRN,MR X 1 Court Joy, PA-C   50 mg at 12/27/12 2244    Lab Results:  Results for orders placed during the hospital encounter of 12/26/12 (from the past 48 hour(s))  HIV ANTIBODY (ROUTINE TESTING)     Status: None   Collection  Time    12/28/12  7:49 PM      Result Value Range   HIV NON REACTIVE  NON REACTIVE   Comment: Performed at [facility]  HEPATITIS PANEL, ACUTE     Status: None   Collection Time    12/28/12  7:49 PM      Result Value Range   Hepatitis B Surface Ag NEGATIVE  NEGATIVE   HCV Ab NEGATIVE  NEGATIVE   Comment: Performed at [facility]   Hep A IgM PENDING  NEGATIVE   Hep B C IgM PENDING  NEGATIVE  RPR     Status: None   Collection Time    12/28/12  7:49 PM      Result Value Range   RPR NON REACTIVE  NON REACTIVE   Comment: Performed at [facility]    Physical Findings: AIMS: Facial and Oral Movements Muscles of Facial Expression: None, normal Lips and Perioral Area: None, normal Jaw: None, normal Tongue: None, normal,Extremity Movements Upper (arms, wrists, hands, fingers): None, normal Lower (legs, knees, ankles, toes): None, normal, Trunk Movements Neck, shoulders, hips: None, normal, Overall Severity Severity of abnormal movements (highest score from questions above): None, normal Incapacitation due to abnormal movements: None, normal Patient's awareness of abnormal movements (rate only patient's report): No Awareness, Dental Status Current problems with teeth and/or dentures?: No Does patient usually wear dentures?: No  CIWA:  CIWA-Ar Total: 0 COWS:  COWS Total Score: 0  Treatment Plan Summary: Daily contact with patient to assess and evaluate symptoms and progress in treatment Medication management  Plan: Supportive approach/coping skills/relapse prevention           Trial with Zoloft 25 mg daily            D/C Trazodone  Medical Decision Making Problem Points:  Review of psycho-social stressors (1) Data Points:  Review of medication regiment & side effects (2) Review of new medications or change in dosage (2)  I certify that inpatient services furnished can reasonably be expected to improve the patient's condition.   [name]  A 12/29/2012, 4:38 PM

## 2012-12-29 NOTE — Progress Notes (Signed)
Recreation Therapy Notes  Date: 08.19.2014 Time: 2:30pm Location: [address] Dayroom  Group Topic: Animal Assisted Activities  Goal Area(s) Addresses:  Patient will interact appropriately with dog team.    Behavioral Response: Appropriate, Engaged  Education: Discharge Planning   Education Outcome: Acknowledges understanding   Clinical Observations/Feedback: Dog Team: [employer]. Patient interacted appropriately with dog team, LRT and group members. Patient asked appropriate questions about [facility].   Marykay Lex Laverne Klugh, LRT/CTRS  Kionna Brier L 12/29/2012 4:40 PM

## 2012-12-29 NOTE — [facility] Group Notes (Signed)
[facility] LCSW Group Therapy  12/29/2012 4:22 PM  Type of Therapy:  Group Therapy  Participation Level:  Active  Participation Quality:  Appropriate  Affect:  Appropriate  Cognitive:  Alert  Insight:  Improving  Engagement in Therapy:  Improving  Modes of Intervention:  Discussion, Education, Exploration, Socialization and Support  Summary of Progress/Problems: MHA Speaker came to talk about his personal journey with substance abuse and addiction. The pt processed ways by which to relate to the speaker. MHA speaker provided handouts and educational information pertaining to groups and services offered by the [facility]. Thomas Cobb came to group about 10 minutes late. He was engaged and attentive throughout group. Thomas Cobb asked the speaker multiple questions about his personal experiences with MI and SA issues and about various groups discussed and offered by [facility]. Thomas Cobb thanked the speaker for coming and sharing his personal story. He appears to be making progress in the group setting AEB his level of participation and attentiveness.    Thomas Cobb, Thomas Cobb 12/29/2012, 4:22 PM

## 2012-12-29 NOTE — Progress Notes (Signed)
Recreation Therapy Notes  Date: 08.19.2014 Time: 3:00pm Location: [address] Dayroom  Group Topic: Communication, Team Building, Problem Solving  Goal Area(s) Addresses:  Patient will be able to recognize use of communication, team building and problem solving during course of group activity. Patient will verbalize qualities used to make decisions during group session.  Patient will verbalize ability to use skills to build healthy support system post d/c.   Behavioral Response: Appropriate, Engaged, Attentive  Intervention: Problem Solving Activity  Activity: Life Boat. Patients were given a scenario about being caught on a sinking ship. In this scenario patients were informed all members of group session, in addition to 8 individuals off of a list of 15 could fit on the life boat. List of individuals included people from all socioeconomic status, for example: President Obama, Midwife, Runner, broadcasting/film/video, Education officer, museum.   Education: Life Skills, Discharge Planning, Relapse Prevention  Education Outcome: Acknowledges understanding   Clinical Observations/Feedback: Patient participated in group session. Patient contributed to opening discussion, defining recovery as wellness and sobriety for group. Patient actively participated in group activity, voicing his opinion and debating appropriately with other group members. Patient additionally asked for clarification when needed, as well as helped group come up with an organized way to identify individuals who would be put on life boat. Patient contributed to wrap up discussion identifying team work as a Marketing executive used during group session. Patient related team work to attending meeting post d/c. Patient additionally identified skill set and compromise as qualities needed to make activity successful. Patient able to make connection between qualities identified and building a healthy support system post d/c.    Marykay Lex Ava Deguire, LRT/CTRS  Jearl Klinefelter 12/29/2012 4:17 PM

## 2012-12-29 NOTE — [facility] Suicide Risk Assessment (Signed)
[facility] INPATIENT: Family/Significant Other Suicide Prevention Education  Suicide Prevention Education:  Education Completed; No one has been identified by the patient as the family member/significant other with whom the patient will be residing, and identified as the person(s) who will aid the patient in the event of a mental health crisis (suicidal ideations/suicide attempt).   Pt did not c/o SI at admission, nor have they endorsed SI during their stay here. SPE not required. SPI pamphlet given to pt and he was encouraged to ask questions/talk about concerns relating to SPE.  [employer], LCSWA 12/29/2012 11:49 AM

## 2012-12-29 NOTE — Progress Notes (Signed)
The focus of this group is to educate the patient on the purpose and policies of crisis stabilization and provide a format to answer questions about their admission.  The group details unit policies and expectations of patients while admitted. Patient did not attend. 

## 2012-12-29 NOTE — Progress Notes (Signed)
Patient ID: Thomas Cobb, male   DOB: [birth year], 32 y.o.   MRN: [id number] He had been in bed this AM c/o being sleepy, had received trazodone last night,. Has new  Order for prn sleep med.  This afternoon he said he was feeling much better more alert. Spoke of doing exercise and of the ensure making him feel full. Affect is brighter this PM.

## 2012-12-29 NOTE — Progress Notes (Signed)
  Adult Psychoeducational Group Note  Date:  12/29/2012 Time:  1:29 PM  Group Topic/Focus:  Recovery Goals:   The focus of this group is to identify appropriate goals for recovery and establish a plan to achieve them.  Participation Level:  Did Not Attend  Participation Quality:    Affect:    Cognitive:    Insight:   Engagement in Group:    Modes of Intervention:    Additional Comments:  Pt refused to attend, pt stated that medication was too strong.  Isla Pence M 12/29/2012, 1:29 PM

## 2012-12-30 LAB — GC/CHLAMYDIA PROBE AMP
CT Probe RNA: NEGATIVE
GC Probe RNA: NEGATIVE

## 2012-12-30 LAB — HEPATITIS PANEL, ACUTE: Hep B C IgM: NEGATIVE

## 2012-12-30 MED ORDER — NICOTINE 7 MG/24HR TD PT24
7.0000 mg | MEDICATED_PATCH | Freq: Every day | TRANSDERMAL | Status: DC
  Administered 2012-12-30: 7 mg via TRANSDERMAL
  Filled 2012-12-30: qty 1
  Filled 2012-12-30: qty 7
  Filled 2012-12-30 (×2): qty 1

## 2012-12-30 MED ORDER — MIRTAZAPINE 30 MG PO TABS: 30.0000 mg | ORAL_TABLET | Freq: Every day | ORAL | Status: DC

## 2012-12-30 MED ORDER — SERTRALINE HCL 25 MG PO TABS: 25.0000 mg | ORAL_TABLET | Freq: Every day | ORAL | Status: DC
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
## 2012-12-30 NOTE — Tx Team (Signed)
Interdisciplinary Treatment Plan Update (Adult)  Date: 12/30/2012   Time Reviewed: 10:16 AM  Progress in Treatment:  Attending groups: Yes Participating in groups:  Yes Taking medication as prescribed: Yes  Tolerating medication: Yes  Family/Significant othe contact made: No. SPE not required for pt.  Patient understands diagnosis: Yes, AEB seeking treatment for ETOH detox, substance abuse (cocaine), and depression. Discussing patient identified problems/goals with staff: Yes  Medical problems stabilized or resolved: Yes  Denies suicidal/homicidal ideation: Yes self report/group Patient has not harmed self or Others: Yes  New problem(s) identified: n/a  Discharge Plan or Barriers: Pt will got to [facility] on Sept 4th and will followup at [facility] for med management. Additional comments: Thomas Cobb is an 32 y.o. male that was seen via tele assessment this day after presenting to [facility] requesting detox from alcohol and cocaine. Pt reported he "needs help" and "I can't do it on my own." Pt stated his girlfriend brought him in for help. Pt stated he drinks 1-2 shots-1 pint liquor daily, last drink last night. Pt stated smokes 1 blunt marijuana daily, last smoked last night. Pt stated uses cocaine 3-4 x/week, last use last night and pt stated he uses 1-2 grams when he uses. Pt stated his SA is interfering with his relationship with his girlfriend and children, as well as causing financial stress. Pt endorses sx of depression. Pt denies SI, HI or psychosis. Pt denies any previous MH or SA treatment. Pt stated he has never tried to get sober on his own. Pt stated he has a court date for possession of pills with no label on 01/18/13. Pt stated he never used the pills, but that his friend gave them to him. Pt denies current withdrawal sx.  Reason for Continuation of Hospitalization: d/c today Estimated length of stay: d/c today For review of initial/current patient goals, please see plan of care.   Attendees:  Patient:    Family:    Physician: Geoffery Lyons  12/30/2012 10:15 AM   Nursing: Lupita Leash RN 12/30/2012 10:15 AM   Clinical Social Worker [employer], LCSWA  12/30/2012 10:15 AM   Other: Philippa Chester RN 12/30/2012 10:15 AM   Other: Aggie PA 12/30/2012 10:16 AM   Other: Darden Dates Nurse CM 12/30/2012 10:16 AM   Other:    Scribe for Treatment Team:  [employer] LCSWA 12/30/2012 10:16 AM

## 2012-12-30 NOTE — [facility] Group Notes (Signed)
[facility] LCSW Aftercare Discharge Planning Group Note   12/30/2012 9:29 AM  Participation Quality:  Minimal   Mood/Affect:  Depressed  Depression Rating:  9  Anxiety Rating:  10  Thoughts of Suicide:  No Will you contract for safety?   NA  Current AVH:  No  Plan for Discharge/Comments:  Pt stated that he was "ready for this transition to be over." He reports feeling anxious about leaving "it's bittersweet." Pt has [facility] date set for Sept 4th. He requested this date in order to spend time with his young son and be there for his bday party. Pt plans to attend AA and MHA meetings during this time to avoid relapse. He will followup at [facility] for med management. Pt asking for letter stating that he had been at [facility] for treatment.   Transportation Means: girlfriend   Supports: Database administrator, Research scientist (physical sciences)

## 2012-12-30 NOTE — Discharge Summary (Signed)
Physician Discharge Summary Note  Patient:  Thomas Cobb is an 32 y.o., male MRN:  [id number] DOB:  December 03, 1980 Patient phone:  [phone] (home)  Patient address:   [address] [place] [address],   Date of Admission:  12/26/2012 Date of Discharge: 12/30/12  Reason for Admission:  Alcohol detox  Discharge Diagnoses: Principal Problem:   Cocaine abuse with cocaine-induced mood disorder Active Problems:   Alcohol dependence   Alcohol abuse   PTSD (post-traumatic stress disorder)  Review of Systems  Constitutional: Negative.   HENT: Negative.   Eyes: Negative.   Respiratory: Negative.   Cardiovascular: Negative.   Gastrointestinal: Negative.   Genitourinary: Negative.   Musculoskeletal: Negative.   Skin: Negative.   Neurological: Negative.   Endo/Heme/Allergies: Negative.   Psychiatric/Behavioral: Positive for depression (Stabilized with medication prior to discharge) and substance abuse (Alcoholism, Cocaine dependence). Negative for suicidal ideas, hallucinations and memory loss. The patient is nervous/anxious (Stabilized with medication prior to discharge). The patient does not have insomnia.    Axis Diagnosis:   AXIS I:  Post Traumatic Stress Disorder and Alcohol dependence, Cocaine abuse with cocaine-induced mood disorder AXIS II:  Deferred AXIS III:   Past Medical History  Diagnosis Date  . Depression    AXIS IV:  other psychosocial or environmental problems and Chronic alcoholism, cocaine depndence AXIS V:  63  Level of Care:  OP  Hospital Course:  Due to inability to excel, the patient decided he needed to detox off of alcohol and drug use. His work was being affected due to partying and staying up most of the night. Thomas Cobb reports he "needs help" and "I can't do it on my own." He stated his girlfriend brought him in for help, drinks 1-2 shots-1 pint liquor daily, last drink last night--smokes 1 blunt marijuana daily, last smoked last  night--uses cocaine 3-4 x/week, last use last night and pt stated he uses 1-2 grams when he uses. Thomas Cobb stated his SA is interfering with his relationship with his girlfriend and children, as well as causing financial stress. endorses depression and anxiety. His drug abuse increases when he thinks about his father's drug use and his death in Sep 26, 1993 of AIDS, decreases when he thinks of his two boys, 31 yo boys (non-twins). Thomas Cobb stated he has a court date for possession of pills with no label on 01/18/13. He stated he never used the pills, but that his friend gave them to him. Irritable at the beginning of the assessment but then became more interactive and pleasant, fair eye contact, alert and oriented x 3, thought process coherent, answers questions appropriately.   Upon admission into this hospital, and after admission assessment/evaluation coupled with UDS/Toxicology reports, it was determined that Thomas Cobb will need detoxification treatment protocol to stabilize his systems of drug intoxication and to combat the withdrawal symptoms of these substances as well.  He was then started on Librium detoxification treatment protocol. He was also enrolled in group counseling sessions and activities to learn coping skills that should help him after discharge to cope better and manage his substance abuse issues to sustain a much longer sobriety. He also attended AA/NA meetings being offered and held on this unit. He has no previously existing and or identifiable medical conditions that required treatment and or monitoring. However, he did make a request for STD tests, including HIV test because he has been living recklessly and engaging in an unprotected sex when under the influence. Labs drawn and ran. See lab  reports. Thomas Cobb was monitored closely for any potential problems that may arise as a result of and or during detoxification treatment. Patient tolerated his treatment regimen and detoxification treatment  protocol without any significant adverse effects and or reactions presented.  Thomas Cobb also was ordered and received Sertraline 25 mg daily for depression and Remeron 30 mg daily for depression, sleep and appetite stimulant Patient attended treatment team meeting this am and met with the treatment team members. His reason for admission, present symptoms, substance abuse issues, response to treatment and discharge plans discussed. Patient endorsed that he is doing well and stable for discharge to pursue the next phase of his substance abuse treatment. It was then agreed upon that he will follow-up care at the [facility] clinic here in [place], [place] for medication management/routine psychiatric care. He has been instructed that this is a walk-in appointment between the hours of 08:00 - 09:00 am, Monday thru Friday. And for substance abuse treatment, patient will start at the [facility] in [place], [place] on 01/14/13 at 08:00 am. At the mean time, Thomas Cobb is being discharged to his home with girlfriend until 01/14/13. The addresses, dates, times and contact information for [facility] and [facility] provided for patient in writing.  Besides the treatments received here and scheduled outpatient psychiatric services , patient was encouraged to join/attend AA/NA meetings offered and held within his community as well for a much needed peer support. Upon discharge, patient adamantly denies suicidal, homicidal ideations, auditory, visual hallucinations, delusional thoughts and or withdrawal symptoms. Patient left [facility] with all personal belongings in no apparent distress. He received 2 weeks worth supply samples of his [facility] discharge medications provided by [facility] pharmacy. Transportation per family.  Consults:  psychiatry  Significant Diagnostic Studies:  labs: CBC with diff, CMP, UDS, Toxicology tests, U/A  Discharge Vitals:   Blood pressure 119/79, pulse 67, temperature 97.1 F (36.2 C), temperature  source Oral, resp. rate 16, height 6' (1.829 m), weight 60.782 kg (134 lb). Body mass index is 18.17 kg/(m^2). Lab Results:   Results for orders placed during the hospital encounter of 12/26/12 (from the past 72 hour(s))  HIV ANTIBODY (ROUTINE TESTING)     Status: None   Collection Time    12/28/12  7:49 PM      Result Value Range   HIV NON REACTIVE  NON REACTIVE   Comment: Performed at [facility]  HEPATITIS PANEL, ACUTE     Status: None   Collection Time    12/28/12  7:49 PM      Result Value Range   Hepatitis B Surface Ag NEGATIVE  NEGATIVE   HCV Ab NEGATIVE  NEGATIVE   Hep A IgM NEGATIVE  NEGATIVE   Hep B C IgM NEGATIVE  NEGATIVE   Comment: (NOTE)     High levels of Hepatitis B Core IgM antibody are detectable     during the acute stage of Hepatitis B. This antibody is used     to differentiate current from past HBV infection.     Performed at [facility]  RPR     Status: None   Collection Time    12/28/12  7:49 PM      Result Value Range   RPR NON REACTIVE  NON REACTIVE   Comment: Performed at [facility]  GC/CHLAMYDIA PROBE AMP     Status: None   Collection Time    12/29/12  6:25 AM      Result Value Range  CT Probe RNA NEGATIVE  NEGATIVE   GC Probe RNA NEGATIVE  NEGATIVE   Comment: (NOTE)                                                                                              Normal Reference Range: Negative          Assay performed using the Gen-Probe APTIMA COMBO2 (R) Assay.     Acceptable specimen types for this assay include APTIMA Swabs (Unisex,     endocervical, urethral, or vaginal), first void urine, and ThinPrep     liquid based cytology samples.     Performed at [facility]   Physical Findings: AIMS: Facial and Oral Movements Muscles of Facial Expression: None, normal Lips and Perioral Area: None, normal Jaw: None, normal Tongue: None, normal,Extremity Movements Upper (arms, wrists, hands, fingers): None,  normal Lower (legs, knees, ankles, toes): None, normal, Trunk Movements Neck, shoulders, hips: None, normal, Overall Severity Severity of abnormal movements (highest score from questions above): None, normal Incapacitation due to abnormal movements: None, normal Patient's awareness of abnormal movements (rate only patient's report): No Awareness, Dental Status Current problems with teeth and/or dentures?: No Does patient usually wear dentures?: No  CIWA:  CIWA-Ar Total: 0 COWS:  COWS Total Score: 0  Psychiatric Specialty Exam: See Psychiatric Specialty Exam and Suicide Risk Assessment completed by Attending Physician prior to discharge.  Discharge destination:  Home, then to [facility] on 01/14/13  Is patient on multiple antipsychotic therapies at discharge:  No   Has Patient had three or more failed trials of antipsychotic monotherapy by history:  No  Recommended Plan for Multiple Antipsychotic Therapies: NA     Medication List    STOP taking these medications       ibuprofen 200 MG tablet  Commonly known as:  ADVIL,MOTRIN      TAKE these medications     Indication   mirtazapine 30 MG tablet  Commonly known as:  REMERON  Take 1 tablet (30 mg total) by mouth at bedtime. For depression/sleep   Indication:  Trouble Sleeping, Major Depressive Disorder     sertraline 25 MG tablet  Commonly known as:  ZOLOFT  Take 1 tablet (25 mg total) by mouth daily. For depression   Indication:  Major Depressive Disorder       Follow-up Information   Follow up with [facility]  On 01/14/2013. (Arrive by 8AM with ID, 30 day medication supply, and clothing. )    Contact information:   [address] [place], [place] [address] phone: [phone] fax: [phone]      Follow up with [facility]. (Walk in Monday through Friday between 8am-9am for hospital followup/medication management. )    Contact information:   [address], [place] [address] phone:  [phone] fax: [phone]     Follow-up recommendations: Activity:  As tolerated Diet: As recommended by your primary care doctor. Keep all scheduled follow-up appointments as recommended.  Agree with assessment and and plan Comments: Take all your medications as prescribed by your mental healthcare provider. Report any adverse effects and or reactions  from your medicines to your outpatient provider promptly. Patient is instructed and cautioned to not engage in alcohol and or illegal drug use while on prescription medicines. In the event of worsening symptoms, patient is instructed to call the crisis hotline, 911 and or go to the nearest ED for appropriate evaluation and treatment of symptoms. Follow-up with your primary care provider for your other medical issues, concerns and or health care needs.   Total Discharge Time:  Greater than 30 minutes.  Signed: Sanjuana Kava, PMHNP- BC, FNP-BC 12/30/2012, 10:57 AM Agree with assessment and plan Rachael Fee.

## 2012-12-30 NOTE — Progress Notes (Signed)
[facility] Adult Case Management Discharge Plan :  Will you be returning to the same living situation after discharge: Yes,  living with girlfriend until admission into [facility] At discharge, do you have transportation home?:Yes,  girlfriend or grandmother Do you have the ability to pay for your medications:Yes,  Medicaid  Release of information consent forms completed and in the chart;  Patient's signature needed at discharge.  Patient to Follow up at: Follow-up Information   Follow up with [facility]  On 01/14/2013. (Arrive by 8AM with ID, 30 day medication supply, and clothing. )    Contact information:   [address] [place], [place] [address] phone: [phone] fax: [phone]      Follow up with [facility]. (Walk in Monday through Friday between 8am-9am for hospital followup/medication management. )    Contact information:   [address], [place] [address] phone: [phone] fax: [phone]      Patient denies SI/HI:   Yes,  during admission/group/self report    Safety Planning and Suicide Prevention discussed:  Yes,  no spe required for this pt as he did not endorse SI during admission or during stay at [facility]. SPI pamphlet provided to pt and he was encouraged to ask questions and talk about concerns.   Smart, Shavonna Corella 12/30/2012, 10:21 AM

## 2012-12-30 NOTE — Progress Notes (Signed)
Adult Psychoeducational Group Note  Date:  12/30/2012 Time:  12:18 PM  Group Topic/Focus:  Personal Choices and Values:   The focus of this group is to help patients assess and explore the importance of values in their lives, how their values affect their decisions, how they express their values and what opposes their expression.  Participation Level:  Did Not Attend  Participation Quality:  Did Not Attend  Affect:  Did Not Attend  Cognitive:  Did Not Attend  Insight: Did Not Attend  Engagement in Group:  Did Not Attend  Modes of Intervention:  Did Not Attend  Additional Comments:    Thomas Cobb 12/30/2012, 12:18 PM

## 2013-01-04 NOTE — Progress Notes (Signed)
Patient Discharge Instructions:  After Visit Summary (AVS):   Faxed to:  01/04/13 Discharge Summary Note:   Faxed to:  01/04/13 Psychiatric Admission Assessment Note:   Faxed to:  01/04/13 Suicide Risk Assessment - Discharge Assessment:   Faxed to:  01/04/13 Faxed/Sent to the Next Level Care provider:  01/04/13 Faxed to [facility] @ [phone] Faxed to [facility] @ [phone]  Jerelene Redden, 01/04/2013, 3:26 PM

## 2013-01-25 ENCOUNTER — Emergency Department (HOSPITAL_BASED_OUTPATIENT_CLINIC_OR_DEPARTMENT_OTHER)
Admission: EM | Admit: 2013-01-25 | Discharge: 2013-01-25 | Disposition: A | Discharge status: Home / Self Care | Payer: Self-pay | Attending: Emergency Medicine | Admitting: Emergency Medicine

## 2013-01-25 ENCOUNTER — Encounter (HOSPITAL_BASED_OUTPATIENT_CLINIC_OR_DEPARTMENT_OTHER): Payer: Self-pay | Admitting: Student

## 2013-01-25 DIAGNOSIS — K029 Dental caries, unspecified: Secondary | ICD-10-CM | POA: Insufficient documentation

## 2013-01-25 DIAGNOSIS — F17200 Nicotine dependence, unspecified, uncomplicated: Secondary | ICD-10-CM | POA: Insufficient documentation

## 2013-01-25 DIAGNOSIS — F3289 Other specified depressive episodes: Secondary | ICD-10-CM | POA: Insufficient documentation

## 2013-01-25 DIAGNOSIS — F329 Major depressive disorder, single episode, unspecified: Secondary | ICD-10-CM | POA: Insufficient documentation

## 2013-01-25 DIAGNOSIS — Z79899 Other long term (current) drug therapy: Secondary | ICD-10-CM | POA: Insufficient documentation

## 2013-01-25 HISTORY — DX: Cocaine abuse, uncomplicated: F14.10

## 2013-01-25 HISTORY — DX: Dental caries, unspecified: K02.9

## 2013-01-25 MED ORDER — TRAMADOL HCL 50 MG PO TABS: 50.0000 mg | ORAL_TABLET | Freq: Four times a day (QID) | ORAL | Status: DC | PRN

## 2013-01-25 MED ORDER — HYDROCODONE-ACETAMINOPHEN 5-325 MG PO TABS: 1.0000 | ORAL_TABLET | Freq: Four times a day (QID) | ORAL | Status: DC | PRN

## 2013-01-25 MED ORDER — PENICILLIN V POTASSIUM 250 MG PO TABS: 250.0000 mg | ORAL_TABLET | Freq: Four times a day (QID) | ORAL | Status: AC

## 2013-01-25 NOTE — ED Notes (Signed)
Dental caries and pain - upper right molar

## 2013-01-25 NOTE — ED Notes (Signed)
Pt in from [facility]

## 2013-01-25 NOTE — ED Provider Notes (Signed)
CSN: [id number]     Arrival date & time 01/25/13  1703 History  This chart was scribed for Gwyneth Sprout, MD by Joaquin Music, ED Scribe. This patient was seen in room MH05/MH05 and the patient's care was started at 5:20 PM   Chief Complaint  Patient presents with  . Dental Pain    The history is provided by the patient. No language interpreter was used.   HPI Comments: Thomas Cobb is a 32 y.o. male who presents to the Emergency Department complaining of constant, moderate, gradually worsening upper right dental pain, located in his gums. Pt states his upper molar broke off, which he believes is causing his pain. He also has multiple dental caries to his upper molars. He denies fever, facial pain or swelling, trouble swallowing or any other symptoms.   Past Medical History  Diagnosis Date  . Depression   . Dental caries   . Cocaine abuse    History reviewed. No pertinent past surgical history. History reviewed. No pertinent family history. History  Substance Use Topics  . Smoking status: Current Every Day Smoker -- 0.30 packs/day for 15 years    Types: Cigarettes  . Smokeless tobacco: Never Used  . Alcohol Use: Yes     Comment: 3 shots/day, wine red    Review of Systems  HENT: Positive for dental problem. Negative for facial swelling and trouble swallowing.   All other systems reviewed and are negative.   Allergies  Review of patient's allergies indicates no known allergies.  Home Medications   Current Outpatient Rx  Name  Route  Sig  Dispense  Refill  . mirtazapine (REMERON) 30 MG tablet   Oral   Take 1 tablet (30 mg total) by mouth at bedtime. For depression/sleep   30 tablet   0   . sertraline (ZOLOFT) 25 MG tablet   Oral   Take 1 tablet (25 mg total) by mouth daily. For depression   30 tablet   0                                                                                                  0    Triage Vitals: BP 131/89  Pulse 83   Temp(Src) 98.2 F (36.8 C) (Oral)  Resp 20  Wt 140 lb (63.504 kg)  BMI 18.98 kg/m2  SpO2 100%  Physical Exam  Nursing note and vitals reviewed. Constitutional: He is oriented to person, place, and time. He appears well-developed and well-nourished. No distress.  HENT:  Head: Normocephalic and atraumatic.  Mouth/Throat: No oral lesions. No trismus in the jaw. Dental caries present. No dental abscesses, edematous or lacerations.    Multiple severe dental decays in back of mouth. Tooth that is hurting is right upper third molar that has been broken off but no signs of abscess or drainage.  Eyes: EOM are normal.  Neck: Neck supple. No tracheal deviation present.  Cardiovascular: Normal rate, regular rhythm and normal heart sounds.   Pulmonary/Chest: Effort normal. No respiratory distress.  Musculoskeletal: Normal range of motion.  Neurological: He is alert and oriented  to person, place, and time.  Skin: Skin is warm and dry.  Psychiatric: He has a normal mood and affect. His behavior is normal.    ED Course  Procedures  DIAGNOSTIC STUDIES: Oxygen Saturation is 100% on RA, normal by my interpretation.    COORDINATION OF CARE: 5:21 PM-Discussed treatment plan which includes discharge with prescriptions for pain medication and antibiotics. Will also refer pt to a dentist. Reccommended pt to do warm salt water gargles with pt at bedside and pt agreed to plan.   Labs Review Labs Reviewed - No data to display Imaging Review No results found.  MDM   1. Dental caries    Pt with dental caries and facial swelling.  No signs of ludwig's angina or difficulty swallowing and no systemic symptoms. Will treat with PCN and have pt f/u with dentist.   I personally performed the services described in this documentation, which was scribed in my presence.  The recorded information has been reviewed and considered.    Gwyneth Sprout, MD 01/25/13 1743

## 2013-01-25 NOTE — ED Notes (Signed)
RN at bedside

## 2014-02-12 ENCOUNTER — Encounter (HOSPITAL_COMMUNITY): Payer: Self-pay | Admitting: Emergency Medicine

## 2014-02-12 ENCOUNTER — Emergency Department (HOSPITAL_COMMUNITY)
Admission: EM | Admit: 2014-02-12 | Discharge: 2014-02-12 | Disposition: A | Discharge status: Home / Self Care | Payer: Self-pay | Attending: Emergency Medicine | Admitting: Emergency Medicine

## 2014-02-12 DIAGNOSIS — F329 Major depressive disorder, single episode, unspecified: Secondary | ICD-10-CM | POA: Insufficient documentation

## 2014-02-12 DIAGNOSIS — S00531A Contusion of lip, initial encounter: Secondary | ICD-10-CM | POA: Insufficient documentation

## 2014-02-12 DIAGNOSIS — Z720 Tobacco use: Secondary | ICD-10-CM | POA: Insufficient documentation

## 2014-02-12 DIAGNOSIS — Z79899 Other long term (current) drug therapy: Secondary | ICD-10-CM | POA: Insufficient documentation

## 2014-02-12 DIAGNOSIS — Z8719 Personal history of other diseases of the digestive system: Secondary | ICD-10-CM | POA: Insufficient documentation

## 2014-02-12 NOTE — Discharge Instructions (Signed)
Tylenol or Motrin for pain, return for significant swelling, pus, fever

## 2014-02-12 NOTE — ED Notes (Signed)
He is awake and alert and in no distress.  He tells me he is anxious, but feeling "better".  Dr. Hyacinth MeekerMiller enters the room at the end of my assessment.

## 2014-02-12 NOTE — ED Notes (Signed)
Bed: [id number] Expected date: 02/12/14 Expected time: 4:48 PM Means of arrival: Ambulance Comments: Anxiety

## 2014-02-12 NOTE — ED Notes (Signed)
Pt arrived by EMS with [employer], "I was trying to get me sum winter gear." " Walking out of [employer], officer pulled him to side, officer asked me if I paid for that and I kinds figured I had some warrants on me and I "flight or fight" and um I run." Pt has redness to left eye area and small cut to upper rt side of lip. Denies any other injuries

## 2014-02-12 NOTE — ED Provider Notes (Signed)
CSN: [id number]     Arrival date & time 02/12/14  1649 History   First MD Initiated Contact with Patient 02/12/14 1655     Chief Complaint  Patient presents with  . Anxiety     (Consider location/radiation/quality/duration/timing/severity/associated sxs/prior Treatment) HPI Comments: Pt was brought in by police after he was reportedly caught shoplifting at [employer], he tried to flee on it from police and he was taken to the ground and immobilized with handcuffs. He complains of pain to the right face, no other complaints other than some mild neck pain in the left trapezius muscle.  Patient is a 33 y.o. male presenting with anxiety. The history is provided by the patient.  Anxiety    Past Medical History  Diagnosis Date  . Depression   . Dental caries   . Cocaine abuse    History reviewed. No pertinent past surgical history. No family history on file. History  Substance Use Topics  . Smoking status: Current Every Day Smoker -- 0.30 packs/day for 15 years    Types: Cigarettes  . Smokeless tobacco: Never Used  . Alcohol Use: Yes     Comment: 3 shots/day, wine red    Review of Systems  Skin: Positive for wound.  Neurological: Negative for syncope.      Allergies  Review of patient's allergies indicates no known allergies.  Home Medications   Prior to Admission medications   Medication Sig Start Date End Date Taking? Authorizing Provider  mirtazapine (REMERON) 30 MG tablet Take 1 tablet (30 mg total) by mouth at bedtime. For depression/sleep 12/30/12   Sanjuana KavaAgnes I Nwoko, NP  sertraline (ZOLOFT) 25 MG tablet Take 1 tablet (25 mg total) by mouth daily. For depression 12/30/12   Sanjuana KavaAgnes I Nwoko, NP  traMADol (ULTRAM) 50 MG tablet Take 1 tablet (50 mg total) by mouth every 6 (six) hours as needed for pain. 01/25/13   Gwyneth SproutWhitney Plunkett, MD   BP 135/78  Pulse 87  Temp(Src) 97.5 F (36.4 C) (Oral)  Resp 22  SpO2 100% Physical Exam  Constitutional: He appears well-developed and  well-nourished. No distress.  HENT:  Head: Normocephalic.  Small contusion of the right upper lip, sub millimeter laceration, no malocclusion, able to open the mouth without pain in the jaw, no tenderness or subluxation of the teeth  Eyes: Conjunctivae are normal. No scleral icterus.  Cardiovascular: Normal rate and regular rhythm.   Pulmonary/Chest: Effort normal and breath sounds normal.  Musculoskeletal: Normal range of motion. He exhibits tenderness ( ttp over the laceration site). He exhibits no edema.  Neurological: He is alert. Coordination normal.  Sensation and motor intact  Skin: Skin is warm and dry. He is not diaphoretic.  Tiny laceration of the right upper lip, no bleeding, no underlying visualized foreign body or subcutaneous fat    ED Course  Procedures (including critical care time) Labs Review Labs Reviewed - No data to display  Imaging Review No results found.    MDM   Final diagnoses:  Contusion of lip, initial encounter    No tenderness of the facial bones, no tenderness of the cervical spine, mild contusion of the right upper lip, extremities are all within normal limits, soft compartments, supple joints, stable for discharge in the care of police. No indication to treat this laceration with sutures, it is tiny, it will heal spontaneously. Wound care provided    Vida RollerBrian D Aislin Onofre, MD 02/12/14 [phone]

## 2014-02-12 NOTE — ED Notes (Signed)
I cleanse the very tiny, superficial abrasion just above right of midline upper lip with Zephiran.  Police remain in constant attendance; maintaining right handcuff.

## 2014-03-09 ENCOUNTER — Emergency Department (HOSPITAL_COMMUNITY)
Admission: EM | Admit: 2014-03-09 | Discharge: 2014-03-09 | Disposition: A | Discharge status: Home / Self Care | Payer: No Typology Code available for payment source | Attending: Emergency Medicine | Admitting: Emergency Medicine

## 2014-03-09 ENCOUNTER — Encounter (HOSPITAL_COMMUNITY): Payer: Self-pay | Admitting: Emergency Medicine

## 2014-03-09 DIAGNOSIS — Y92410 Unspecified street and highway as the place of occurrence of the external cause: Secondary | ICD-10-CM | POA: Insufficient documentation

## 2014-03-09 DIAGNOSIS — Z8719 Personal history of other diseases of the digestive system: Secondary | ICD-10-CM | POA: Insufficient documentation

## 2014-03-09 DIAGNOSIS — S199XXA Unspecified injury of neck, initial encounter: Secondary | ICD-10-CM | POA: Diagnosis present

## 2014-03-09 DIAGNOSIS — Z8659 Personal history of other mental and behavioral disorders: Secondary | ICD-10-CM | POA: Diagnosis not present

## 2014-03-09 DIAGNOSIS — Z720 Tobacco use: Secondary | ICD-10-CM | POA: Insufficient documentation

## 2014-03-09 DIAGNOSIS — Y9389 Activity, other specified: Secondary | ICD-10-CM | POA: Diagnosis not present

## 2014-03-09 DIAGNOSIS — S161XXA Strain of muscle, fascia and tendon at neck level, initial encounter: Secondary | ICD-10-CM

## 2014-03-09 MED ORDER — NAPROXEN 500 MG PO TABS: 500.0000 mg | ORAL_TABLET | Freq: Two times a day (BID) | ORAL | Status: DC
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
## 2014-05-16 ENCOUNTER — Encounter (HOSPITAL_COMMUNITY): Payer: Self-pay | Admitting: Emergency Medicine

## 2014-05-16 ENCOUNTER — Emergency Department (HOSPITAL_COMMUNITY)
Admission: EM | Admit: 2014-05-16 | Discharge: 2014-05-16 | Disposition: A | Discharge status: Home / Self Care | Payer: No Typology Code available for payment source | Attending: Emergency Medicine | Admitting: Emergency Medicine

## 2014-05-16 DIAGNOSIS — F1410 Cocaine abuse, uncomplicated: Secondary | ICD-10-CM | POA: Insufficient documentation

## 2014-05-16 DIAGNOSIS — F1310 Sedative, hypnotic or anxiolytic abuse, uncomplicated: Secondary | ICD-10-CM | POA: Insufficient documentation

## 2014-05-16 DIAGNOSIS — R197 Diarrhea, unspecified: Secondary | ICD-10-CM | POA: Insufficient documentation

## 2014-05-16 DIAGNOSIS — F1910 Other psychoactive substance abuse, uncomplicated: Secondary | ICD-10-CM

## 2014-05-16 DIAGNOSIS — Z791 Long term (current) use of non-steroidal anti-inflammatories (NSAID): Secondary | ICD-10-CM | POA: Insufficient documentation

## 2014-05-16 DIAGNOSIS — F1210 Cannabis abuse, uncomplicated: Secondary | ICD-10-CM | POA: Insufficient documentation

## 2014-05-16 DIAGNOSIS — F1010 Alcohol abuse, uncomplicated: Secondary | ICD-10-CM | POA: Insufficient documentation

## 2014-05-16 DIAGNOSIS — R110 Nausea: Secondary | ICD-10-CM | POA: Insufficient documentation

## 2014-05-16 DIAGNOSIS — Z8719 Personal history of other diseases of the digestive system: Secondary | ICD-10-CM | POA: Insufficient documentation

## 2014-05-16 DIAGNOSIS — R1013 Epigastric pain: Secondary | ICD-10-CM | POA: Insufficient documentation

## 2014-05-16 DIAGNOSIS — E876 Hypokalemia: Secondary | ICD-10-CM | POA: Insufficient documentation

## 2014-05-16 DIAGNOSIS — Z720 Tobacco use: Secondary | ICD-10-CM | POA: Insufficient documentation

## 2014-05-16 LAB — HEPATIC FUNCTION PANEL
ALT: 13 U/L (ref 0–53)
AST: 24 U/L (ref 0–37)
Albumin: 3.6 g/dL (ref 3.5–5.2)
Alkaline Phosphatase: 47 U/L (ref 39–117)
BILIRUBIN TOTAL: 0.6 mg/dL (ref 0.3–1.2)
Bilirubin, Direct: <0.1 mg/dL (ref 0.0–0.3)
Total Protein: 6.6 g/dL (ref 6.0–8.3)

## 2014-05-16 LAB — CBC WITH DIFFERENTIAL/PLATELET
BASOS ABS: 0 10*3/uL (ref 0.0–0.1)
BASOS PCT: 0 % (ref 0–1)
EOS ABS: 0.1 10*3/uL (ref 0.0–0.7)
EOS PCT: 2 % (ref 0–5)
HEMATOCRIT: 44.9 % (ref 39.0–52.0)
Hemoglobin: 16.4 g/dL (ref 13.0–17.0)
Lymphocytes Relative: 15 % (ref 12–46)
Lymphs Abs: 1.3 10*3/uL (ref 0.7–4.0)
MCH: 31.7 pg (ref 26.0–34.0)
MCHC: 36.5 g/dL — AB (ref 30.0–36.0)
MCV: 86.7 fL (ref 78.0–100.0)
MONO ABS: 0.7 10*3/uL (ref 0.1–1.0)
Monocytes Relative: 8 % (ref 3–12)
Neutro Abs: 6.5 10*3/uL (ref 1.7–7.7)
Neutrophils Relative %: 75 % (ref 43–77)
PLATELETS: 165 10*3/uL (ref 150–400)
RBC: 5.18 MIL/uL (ref 4.22–5.81)
RDW: 13.1 % (ref 11.5–15.5)
WBC: 8.6 10*3/uL (ref 4.0–10.5)

## 2014-05-16 LAB — RAPID URINE DRUG SCREEN, HOSP PERFORMED
Amphetamines: NOT DETECTED
Barbiturates: NOT DETECTED
Benzodiazepines: POSITIVE — AB
COCAINE: POSITIVE — AB
OPIATES: NOT DETECTED
Tetrahydrocannabinol: POSITIVE — AB

## 2014-05-16 LAB — BASIC METABOLIC PANEL
Anion gap: 11 (ref 5–15)
BUN: 8 mg/dL (ref 6–23)
CALCIUM: 8.7 mg/dL (ref 8.4–10.5)
CHLORIDE: 101 meq/L (ref 96–112)
CO2: 26 mmol/L (ref 19–32)
CREATININE: 1.01 mg/dL (ref 0.50–1.35)
GFR calc Af Amer: >90 mL/min (ref >90)
GFR calc non Af Amer: >90 mL/min (ref >90)
GLUCOSE: 100 mg/dL — AB (ref 70–99)
Potassium: 3.3 mmol/L — ABNORMAL LOW (ref 3.5–5.1)
Sodium: 138 mmol/L (ref 135–145)

## 2014-05-16 LAB — LIPASE, BLOOD: Lipase: 21 U/L (ref 11–59)

## 2014-05-16 LAB — TROPONIN I

## 2014-05-16 LAB — ETHANOL

## 2014-05-16 MED ORDER — ONDANSETRON HCL 4 MG/2ML IJ SOLN
4.0000 mg | Freq: Once | INTRAMUSCULAR | Status: AC
  Administered 2014-05-16: 4 mg via INTRAVENOUS
  Filled 2014-05-16: qty 2

## 2014-05-16 MED ORDER — GI COCKTAIL ~~LOC~~
30.0000 mL | Freq: Once | ORAL | Status: AC
  Administered 2014-05-16: 30 mL via ORAL
  Filled 2014-05-16: qty 30

## 2014-05-16 MED ORDER — POTASSIUM CHLORIDE CRYS ER 20 MEQ PO TBCR
40.0000 meq | EXTENDED_RELEASE_TABLET | Freq: Once | ORAL | Status: AC
  Administered 2014-05-16: 40 meq via ORAL
  Filled 2014-05-16: qty 2

## 2014-05-16 MED ORDER — SODIUM CHLORIDE 0.9 % IV SOLN
Freq: Once | INTRAVENOUS | Status: AC
  Administered 2014-05-16: 50 mL/h via INTRAVENOUS

## 2014-05-16 MED ORDER — HYDROMORPHONE HCL 1 MG/ML IJ SOLN
1.0000 mg | Freq: Once | INTRAMUSCULAR | Status: AC
  Administered 2014-05-16: 1 mg via INTRAVENOUS
  Filled 2014-05-16: qty 1

## 2014-05-16 MED ORDER — TRAMADOL HCL 50 MG PO TABS: 50.0000 mg | ORAL_TABLET | Freq: Four times a day (QID) | ORAL | Status: DC | PRN

## 2014-05-16 MED ORDER — OMEPRAZOLE 20 MG PO CPDR: 20.0000 mg | DELAYED_RELEASE_CAPSULE | Freq: Every day | ORAL | Status: DC

## 2014-05-16 NOTE — ED Notes (Signed)
Pt reports mid upper abdominal pain starting at 2000 yesterday. Pt denies nausea and vomiting but reports two episodes of diarrhea Sunday morning. Pt reports tenderness at abdominal pain site until pain medication given. Pt denies tenderness or pain at present time.

## 2014-05-16 NOTE — Discharge Instructions (Signed)
Gastritis, Adult °Gastritis is soreness and swelling (inflammation) of the lining of the stomach. Gastritis can develop as a sudden onset (acute) or long-term (chronic) condition. If gastritis is not treated, it can lead to stomach bleeding and ulcers. °CAUSES  °Gastritis occurs when the stomach lining is weak or damaged. Digestive juices from the stomach then inflame the weakened stomach lining. The stomach lining may be weak or damaged due to viral or bacterial infections. One common bacterial infection is the Helicobacter pylori infection. Gastritis can also result from excessive alcohol consumption, taking certain medicines, or having too much acid in the stomach.  °SYMPTOMS  °In some cases, there are no symptoms. When symptoms are present, they may include: °· Pain or a burning sensation in the upper abdomen. °· Nausea. °· Vomiting °· An uncomfortable feeling of fullness after eating. °DIAGNOSIS  °Your caregiver may suspect you have gastritis based on your symptoms and a physical exam. To determine the cause of your gastritis, your caregiver may perform the following: °· Blood or stool tests to check for the H pylori bacterium. °· Gastroscopy. A thin, flexible tube (endoscope) is passed down the esophagus and into the stomach. The endoscope has a light and camera on the end. Your caregiver uses the endoscope to view the inside of the stomach. °· Taking a tissue sample (biopsy) from the stomach to examine under a microscope. °TREATMENT  °Depending on the cause of your gastritis, medicines may be prescribed. If you have a bacterial infection, such as an H pylori infection, antibiotics may be given. If your gastritis is caused by too much acid in the stomach, H2 blockers or antacids may be given. Your caregiver may recommend that you stop taking aspirin, ibuprofen, or other nonsteroidal anti-inflammatory drugs (NSAIDs). °HOME CARE INSTRUCTIONS °· Only take over-the-counter or prescription medicines as directed by  your caregiver. °· If you were given antibiotic medicines, take them as directed. Finish them even if you start to feel better. °· Drink enough fluids to keep your urine clear or pale yellow. °· Avoid foods and drinks that make your symptoms worse, such as: °¨ Caffeine or alcoholic drinks. °¨ Chocolate. °¨ Peppermint or mint flavorings. °¨ Garlic and onions. °¨ Spicy foods. °¨ Citrus fruits, such as oranges, lemons, or limes. °¨ Tomato-based foods such as sauce, chili, salsa, and pizza. °¨ Fried and fatty foods. °· Eat small, frequent meals instead of large meals. °SEEK IMMEDIATE MEDICAL CARE IF:  °· You have black or dark red stools. °· You vomit blood or material that looks like coffee grounds. °· You are unable to keep fluids down. °· Your abdominal pain gets worse. °· You have a fever. °· You do not feel better after 1 week. °· You have any other questions or concerns. °MAKE SURE YOU: °· Understand these instructions. °· Will watch your condition. °· Will get help right away if you are not doing well or get worse. °Document Released: 04/23/2001 Document Revised: 10/29/2011 Document Reviewed: 06/12/2011 °ExitCare® Patient Information ©2015 [employer]. This information is not intended to replace advice given to you by your health care provider. Make sure you discuss any questions you have with your health care provider. ° °Emergency Department Resource Guide °1) Find a Doctor and Pay Out of Pocket °Although you won't have to find out who is covered by your insurance plan, it is a good idea to ask around and get recommendations. You will then need to call the office and see if the doctor you have chosen   will accept you as a new patient and what types of options they offer for patients who are self-pay. Some doctors offer discounts or will set up payment plans for their patients who do not have insurance, but you will need to ask so you aren't surprised when you get to your appointment. ° °2) Contact Your Local  Health Department °Not all health departments have doctors that can see patients for sick visits, but many do, so it is worth a call to see if yours does. If you don't know where your local health department is, you can check in your phone book. The CDC also has a tool to help you locate your state's health department, and many state websites also have listings of all of their local health departments. ° °3) Find a Walk-in Clinic °If your illness is not likely to be very severe or complicated, you may want to try a walk in clinic. These are popping up all over the country in pharmacies, drugstores, and shopping centers. They're usually staffed by nurse practitioners or physician assistants that have been trained to treat common illnesses and complaints. They're usually fairly quick and inexpensive. However, if you have serious medical issues or chronic medical problems, these are probably not your best option. ° °No Primary Care Doctor: °- Call [facility] at  832-8000 - they can help you locate a primary care doctor that  accepts your insurance, provides certain services, etc. °- Physician Referral Service- 1-800-533-3463 ° °Chronic Pain Problems: °Organization         Address  Phone   Notes  °[facility]  (336) 297-2271 Patients need to be referred by their primary care doctor.  ° °Medication Assistance: °Organization         Address  Phone   Notes  °[place] [facility] [address] °[place], [place] [address] (336) 641-8030 --Must be a resident of [place] °-- Must have NO insurance coverage whatsoever (no Medicaid/ Medicare, etc.) °-- The pt. MUST have a primary care doctor that directs their care regularly and follows them in the community °  °[facility]  (866) 331-1348   °[employer]  (888) 892-1162   ° °Agencies that provide inexpensive medical care: °Organization         Address  Phone   Notes  °[facility]  (336) 832-8035    °[facility]    (336) 832-7272   °[facility] [address] °[place], [place] [address] (336) 832-4777   °[facility] [address], °[place] (336) 271-4999   °[facility]    (336) 373-0678   °[facility]    (336) 272-1050   °[facility] ° [address], [place] Phone:  (336) 832-4444, Fax:  (336) 832-4440 Hours of Operation:  9 am - 6 pm, M-F.  Also accepts Medicaid/Medicare and self-pay.  °[facility] ° [address], [place] Phone: (336) 832-3150, Fax: (336) 832-3151. Hours of Operation:  8:30 am - 5:30 pm, M-F.  Also accepts Medicaid and self-pay.  °[facility] [place] [address], [place] Phone: (336) 878-6027   °[facility] [address], [place], [place] (336)723-1848, Ext. 123 Mondays & Thursdays: 7-9 AM.  First 15 patients are seen on a first come, first serve basis. °  ° °Medicaid-accepting [place] Providers: ° °Organization           Address  Phone   Notes  °[facility] [address], [place] (336) 641-2100 Also accepts self-pay patients.  °[facility] [address], [place] ° (336) 856-9996   °[facility] [address], [place] (336) 288-8857   °[facility] [address] [place] [address], [place] (336) 299-7000   °Veita Bland [address], [place]  ° (336) 373-1557 Only accepts [place] Access Medicaid patients after they have their name applied to their card.  ° °Self-Pay (no insurance) in [place]: ° °Organization         Address  Phone   Notes  °Sickle Cell Patients, [facility] [address], [place] (336) 832-1970   °[facility] [address], [place] (336) 832-4400   °[facility] ° [address],  [place] (336) 992-4800   °[facility]/Dr. Osei-Bonsu ° [address] [place] [address], [place] or [address], [place] (336) 841-8500 Phone number for both [place] and [place] locations is the same.  °[facility] [address], [place] (336) 299-0000   °[facility] [address] [place] [address], [place] or [address] (336) 852-7530 °(336) 878-2260   °[facility] [address], [place] (336) 350-1642, phone; (336) 294-5005, fax Sees patients 1st and 3rd Saturday of every month.  Must not qualify for public or private insurance (i.e. Medicaid, Medicare, [place] Health Choice, Veterans' Benefits) • Household income should be no more than 200% of the poverty level •The clinic cannot treat you if you are pregnant or think you are pregnant • Sexually transmitted diseases are not treated at the clinic.  ° ° °Dental Care: °Organization         Address  Phone  Notes  °[facility] [address], [place] (336) 641-6152 Accepts children up to age 21 who are enrolled in Medicaid or [place] Health Choice; pregnant women with a Medicaid card; and children who have applied for Medicaid or [place] Health Choice, but were declined, whose parents can pay a reduced fee at time of service.  °[facility] [place]  [address], [place] (336) 641-7733 Accepts children up to age 21 who are enrolled in Medicaid or [place] Health Choice; pregnant women with a Medicaid card; and children who have applied for Medicaid or  Health Choice, but were declined, whose parents can pay a reduced fee at time of service.  °[facility] ° [address], [place] (336) 641-4533 Patients are seen by appointment only. Walk-ins are not accepted. [facility] will see patients 18 years of age and older. °Monday - Tuesday (8am-5pm) °Most Wednesdays  (8:30-5pm) °$30 per visit, cash only  °[facility] ° [address], [place] (336) 641-4533 Patients are seen by appointment only. Walk-ins are not accepted. [facility] will see patients 18 years of age and older. °One Wednesday Evening (Monthly: Volunteer Based).  $30 per visit, cash only  °[facility]  (919) 537-3737 for adults; Children under age 4, call Graduate Pediatric Dentistry at ([phone]) [phone]. Children aged 4-14, please call (919) [phone] to request a pediatric application. ° Dental services are provided in all areas of dental care including   fillings, crowns and bridges, complete and partial dentures, implants, gum treatment, root canals, and extractions. Preventive care is also provided. Treatment is provided to both adults and children. °Patients are selected via a lottery and there is often a waiting list. °  °[facility] [address], °[place] ° (336) 763-8833 [email or web address] °  °[facility] [address], [place], [place] (336)723-1848, Ext. 123 Second and Fourth Thursday of each month, opens at 6:30 AM; Clinic ends at 9 AM.  Patients are seen on a first-come first-served basis, and a limited number are seen during each clinic.  ° °[facility] ° [address], [place], [place] (336) 723-7904   Eligibility Requirements °You must have lived in [place], [place], or [place] counties for at least the last three months. °  You cannot be eligible for state or federal sponsored healthcare insurance, including Veterans Administration, Medicaid, or Medicare. °  You generally cannot be eligible for healthcare insurance through your employer.  °  How to apply: °Eligibility screenings are held every Tuesday and Wednesday afternoon from 1:00 pm until 4:00 pm. You do not need an appointment for the interview!  °[facility] [address], [place], [place] 336-631-2330   °[place]  [facility]  336-342-8273   °[facility]  336-703-3100   °[facility]  336-570-6415   ° °Behavioral Health Resources in the Community: °Intensive Outpatient Programs °Organization         Address  Phone  Notes  °[facility] [address], [place], [place] 336-878-6098   °[facility] [address], [place], [place] 336-832-9800   °[facility] [address], [place], [place] ° 336-882-2125   °[facility] [address],  °[place], [place] 1-800-853-5163 or 336-641-4981   °Substance Abuse Resources °Organization         Address  Phone  Notes  °[facility]  336-882-2125   °[facility]  336-784-9470   °[facility]  336-285-9073   °[facility]  336-845-3988   °Residential & Outpatient Substance Abuse Program  1-800-659-3381   °Psychological Services °Organization         Address  Phone  Notes  °[facility]  336- 832-9600   °[facility]  336- 378-7881   °[facility] [address], [place] 1-800-853-5163 or 336-641-4981   ° °Mobile Crisis Teams °Organization         Address  Phone  Notes  °[facility], Mobile Crisis Care Unit  1-877-626-1772   °Assertive °[facility] ° [address] [place], [place] 336-834-9664   °Sharon DeEsch [address] °[place] 336-554-5454   ° °Self-Help/Support Groups °Organization         Address  Phone             Notes  °[facility] [place] - variety of support groups  336- 373-1402 Call for more information  °Narcotics Anonymous (NA), [facility] [address], °[place]  2 meetings at this location  ° °Residential Treatment Programs °Organization         Address  Phone  Notes  °[facility] [address],    °[place]  1-866-801-8205   °[facility] ° [address], [place],   704-293-8524   °[facility] [address],   [place] 336-845-3988 Admissions: 8am-3pm M-F  °[facility] [address]    °[place], [place] 336-841-1104   °[facility] [address], [place], [place] 336-379-7146   °[facility] [address]  °[place], [place] 336-285-9073   °[facility] - Intensive Outpatient [address], [place], [place] 336-852-3033   °[facility] [address]  °[place], [place] 1-877-615-2722 or 336-784-9470   °[facility] [address] [place], [place] 336-227-7417 Accepts Medicaid  °[facility] [address]  °[place] 1-800-659-3381 Substance Abuse/Addiction Treatment  ° °[place] Behavioral Health Resources °Organization         Address  Phone  Notes  °[facility]  (888) 581-9988   °Julie Brannon, PhD [address] [place], [place]   (336) 349-5553 or (336) 951-0000   °[facility]   [address] °[place], [place] (336) 349-4454   °[facility] [address], [place], [place] (336) 342-8316 Insurance/Medicaid/sponsorship through [facility]  °[facility] [address]                                    [place], [place] (336) 342-8316 Therapy/tele-psych/case  °[facility] [address]  ° [place], [place] (336) 349-2233    °Dr. Arfeen  (336) 349-4544   °[facility] [place]  [employer] [place] [facility] 1) [address], [place] °2) [address], [place] °3)  [address], [place] (336) 349-3220 °(336) 342-7768 ° °(336) 342-8140   °[place] [facility] (336) 342-1394 or (336) 342-3537 (After Hours)    ° ° °

## 2014-05-16 NOTE — ED Provider Notes (Signed)
Patient is a 34 year old male who signed out to me at shift change who presents with epigastric pain times one day. Epigastric pain is sharp and stabbing with no radiation to the back. Patient does have a history of cocaine and alcohol abuse. Patient states he has been drinking recently.  Physical Exam  BP 112/73 mmHg  Pulse 66  Temp(Src) 97.4 F (36.3 C) (Oral)  Resp 17  SpO2 97%  Physical Exam  Constitutional: He is oriented to person, place, and time. He appears well-developed and well-nourished. No distress.  HENT:  Head: Normocephalic and atraumatic.  Mouth/Throat: Oropharynx is clear and moist. No oropharyngeal exudate.  Eyes: Conjunctivae and EOM are normal. Pupils are equal, round, and reactive to light. No scleral icterus.  Neck: Normal range of motion. Neck supple. No JVD present. No thyromegaly present.  Cardiovascular: Normal rate, regular rhythm, normal heart sounds and intact distal pulses.  Exam reveals no gallop and no friction rub.   No murmur heard. Pulmonary/Chest: Effort normal and breath sounds normal. No respiratory distress. He has no wheezes. He has no rales. He exhibits no tenderness.  Abdominal: Soft. Normal appearance and bowel sounds are normal. He exhibits no distension and no mass. There is tenderness in the epigastric area. There is no rigidity, no rebound, no guarding, no CVA tenderness, no tenderness at McBurney's point and negative Murphy's sign.  Musculoskeletal: Normal range of motion.  Lymphadenopathy:    He has no cervical adenopathy.  Neurological: He is alert and oriented to person, place, and time. He has normal strength. No cranial nerve deficit or sensory deficit. Coordination normal.  Skin: Skin is warm and dry. He is not diaphoretic.  Psychiatric: He has a normal mood and affect. His behavior is normal. Judgment and thought content normal.  Nursing note and vitals reviewed.   ED Course  Procedures  MDM Patient has received 500 mL normal  saline bolus, 1 mg of Dilaudid, and Zofran with great relief. Patient resting comfortably. Basic labs drawn. Labs pending.   Labs returned. CBC is unremarkable, BMP is revealing only mild hypokalemia which was replaced here. LFTs are normal. Lipase is normal. Alcohol is negative. Urine drug screen is positive for cocaine and marijuana and benzodiazepines. UA is negative. Troponin is negative. Suspect that this pain likely be gastritis secondary to alcohol abuse versus possible viral infection. Have given the patient a GI cocktail here. We'll discharge patient home with omeprazole and Ultram. We'll not give narcotics given patient's polysubstance abuse history. Patient stable for discharge at this time. Patient discussed with Dr. Juleen China who agrees with the above workup and plan.  Eben Burow, PA-C 05/16/14 1020  Tomasita Crumble, MD 05/16/14 1512

## 2014-05-16 NOTE — ED Notes (Signed)
Upon introducing self to pt, pt is resting and verbally denies pain.

## 2014-05-16 NOTE — ED Provider Notes (Signed)
CSN: [id number]     Arrival date & time 05/16/14  4098 History   First MD Initiated Contact with Patient 05/16/14 0535     Chief Complaint  Patient presents with  . Abdominal Pain     (Consider location/radiation/quality/duration/timing/severity/associated sxs/prior Treatment) HPI Comments: This a 34 year old male with a history of alcohol abuse and cocaine abuse who presents with 24 hours of epigastric pain that does not radiate.  States the pain is stabbing, worse with palpation or movement.  States he had one episode of loose stools 24 hours ago.  He has constant nausea but no vomiting.  He states he's had "some alcohol."  Does not remember the last time he used cocaine  Patient is a 34 y.o. male presenting with abdominal pain. The history is provided by the patient.  Abdominal Pain Pain location:  Epigastric Pain quality: aching   Pain radiates to:  Does not radiate Pain severity:  Moderate Onset quality:  Sudden Duration:  24 hours Timing:  Constant Progression:  Unchanged Chronicity:  Recurrent Context: alcohol use   Context: not diet changes, not eating, not laxative use, not medication withdrawal, not previous surgeries, not sick contacts, not suspicious food intake and not trauma   Relieved by:  None tried Worsened by:  Palpation Ineffective treatments:  None tried Associated symptoms: diarrhea and nausea   Associated symptoms: no chest pain, no constipation, no shortness of breath and no vomiting   Diarrhea:    Quality:  Watery   Number of occurrences:  1   Severity:  Mild   Progression:  Resolved Nausea:    Severity:  Mild   Onset quality:  Sudden   Duration:  24 hours   Timing:  Unable to specify Risk factors: alcohol abuse     Past Medical History  Diagnosis Date  . Depression   . Dental caries   . Cocaine abuse    History reviewed. No pertinent past surgical history. History reviewed. No pertinent family history. History  Substance Use Topics  .  Smoking status: Current Every Day Smoker -- 0.30 packs/day for 15 years    Types: Cigarettes  . Smokeless tobacco: Never Used  . Alcohol Use: Yes     Comment: 3 shots/day, wine red    Review of Systems  Respiratory: Negative for shortness of breath.   Cardiovascular: Negative for chest pain.  Gastrointestinal: Positive for nausea, abdominal pain and diarrhea. Negative for vomiting and constipation.  All other systems reviewed and are negative.     Allergies  Review of patient's allergies indicates no known allergies.  Home Medications   Prior to Admission medications   Medication Sig Start Date End Date Taking? Authorizing Provider  naproxen (NAPROSYN) 500 MG tablet Take 1 tablet (500 mg total) by mouth 2 (two) times daily with a meal. 03/09/14   Arthor Captain, PA-C  neomycin-bacitracin-polymyxin (NEOSPORIN) ointment Apply 1 application topically 3 (three) times daily as needed for wound care. apply to eye    Historical Provider, MD   BP 126/87 mmHg  Pulse 77  Temp(Src) 97.4 F (36.3 C) (Oral)  Resp 22  SpO2 100% Physical Exam  Constitutional: He is oriented to person, place, and time. He appears well-developed and well-nourished.  HENT:  Head: Normocephalic.  Right Ear: External ear normal.  Left Ear: External ear normal.  Mouth/Throat: Oropharynx is clear and moist.  Eyes: Pupils are equal, round, and reactive to light.  Neck: Normal range of motion.  Cardiovascular: Normal rate and regular rhythm.  Pulmonary/Chest: Effort normal and breath sounds normal.  Abdominal: Soft. He exhibits no distension. There is tenderness in the epigastric area. There is no rigidity, no rebound and no guarding.  Musculoskeletal: Normal range of motion.  Neurological: He is alert and oriented to person, place, and time.  Skin: Skin is warm.  Nursing note and vitals reviewed.   ED Course  Procedures (including critical care time) Labs Review Labs Reviewed  ETHANOL  URINE RAPID  DRUG SCREEN (HOSP PERFORMED)  CBC WITH DIFFERENTIAL  HEPATIC FUNCTION PANEL  TROPONIN I  LIPASE, BLOOD  I-STAT CHEM 8, ED    Imaging Review No results found.   EKG Interpretation None      MDM   Final diagnoses:  None         Arman Filter, NP 05/16/14 1610  Tomasita Crumble, MD 05/16/14 [phone]

## 2014-05-16 NOTE — ED Notes (Signed)
Pt arrives to the ED with a complaint of abdominal pain.  Pt states the pain is a aching in the upper central abdsominal area.  Pt states he has had the pain snce yesterday.  Pt states he has had diarrhea but denies nausea and emesis

## 2014-05-16 NOTE — ED Notes (Signed)
Requested patient to urinate. 

## 2014-05-16 NOTE — ED Notes (Signed)
Pt respiration rate previously charted as 82 was an error in charting.  Actual respirations were 22 rpm

## 2014-07-04 ENCOUNTER — Emergency Department (HOSPITAL_COMMUNITY)
Admission: EM | Admit: 2014-07-04 | Discharge: 2014-07-04 | Disposition: A | Discharge status: Home / Self Care | Payer: Self-pay | Attending: Emergency Medicine | Admitting: Emergency Medicine

## 2014-07-04 ENCOUNTER — Encounter (HOSPITAL_COMMUNITY): Payer: Self-pay

## 2014-07-04 DIAGNOSIS — Z720 Tobacco use: Secondary | ICD-10-CM | POA: Insufficient documentation

## 2014-07-04 DIAGNOSIS — R519 Headache, unspecified: Secondary | ICD-10-CM

## 2014-07-04 DIAGNOSIS — Z8659 Personal history of other mental and behavioral disorders: Secondary | ICD-10-CM | POA: Insufficient documentation

## 2014-07-04 DIAGNOSIS — Z8719 Personal history of other diseases of the digestive system: Secondary | ICD-10-CM | POA: Insufficient documentation

## 2014-07-04 DIAGNOSIS — R51 Headache: Secondary | ICD-10-CM | POA: Insufficient documentation

## 2014-07-04 MED ORDER — KETOROLAC TROMETHAMINE 60 MG/2ML IM SOLN: 60.0000 mg | Freq: Once | INTRAMUSCULAR | Status: DC

## 2014-07-04 MED ORDER — NAPROXEN 500 MG PO TABS: 500.0000 mg | ORAL_TABLET | Freq: Two times a day (BID) | ORAL | Status: DC

## 2014-07-04 MED ORDER — NAPROXEN 500 MG PO TABS: 500.0000 mg | ORAL_TABLET | Freq: Once | ORAL | Status: DC

## 2014-07-04 MED ORDER — METOCLOPRAMIDE HCL 5 MG/ML IJ SOLN: 10.0000 mg | Freq: Once | INTRAMUSCULAR | Status: DC

## 2014-07-04 NOTE — ED Notes (Signed)
Pt presents with c/o headache that started around midnight. Pt reports the headache has been off and on. Pt denies any light sensitivity or nausea. Ambulatory to room.

## 2014-07-04 NOTE — ED Notes (Addendum)
Patient left ED prior to giving medication Patient told ED tech that he had to go to work EDP made aware

## 2014-07-04 NOTE — ED Provider Notes (Signed)
CSN: [id number]     Arrival date & time 07/04/14  4098 History   First MD Initiated Contact with Patient 07/04/14 0345     Chief Complaint  Patient presents with  . Headache    (Consider location/radiation/quality/duration/timing/severity/associated sxs/prior Treatment) HPI Comments: Patient is a 34 year old male with a history of depression and cocaine abuse who presents to the emergency department for further evaluation of headache. Patient reporting a frontal headache which has been waxing and waning since noon yesterday. Patient denies taking any medication for his symptoms as well as any modifying factors. Patient states that he has been experiencing a lot of stress in his life recently and believes his headache may be secondary to increased stress. Patient reports a history of similar headaches, but none that have lasted as long as his current headache. He denies any associated fever, vision changes, vision loss, tinnitus or hearing loss, difficulty speaking or swallowing, nausea, vomiting, photophobia, extremity numbness/paresthesias, and extremity weakness. No head injury or trauma inciting symptoms.  Patient is a 34 y.o. male presenting with headaches. The history is provided by the patient. No language interpreter was used.  Headache   Past Medical History  Diagnosis Date  . Depression   . Dental caries   . Cocaine abuse    History reviewed. No pertinent past surgical history. No family history on file. History  Substance Use Topics  . Smoking status: Current Every Day Smoker -- 0.30 packs/day for 15 years    Types: Cigarettes  . Smokeless tobacco: Never Used  . Alcohol Use: Yes     Comment: 3 shots/day, wine red    Review of Systems  Neurological: Positive for headaches.  All other systems reviewed and are negative.   Allergies  Review of patient's allergies indicates no known allergies.  Home Medications   Prior to Admission medications   Medication Sig Start  Date End Date Taking? Authorizing Provider  loperamide (IMODIUM A-D) 2 MG tablet Take 4 mg by mouth 4 (four) times daily as needed for diarrhea or loose stools.   Yes Historical Provider, MD  omeprazole (PRILOSEC) 20 MG capsule Take 1 capsule (20 mg total) by mouth daily. 05/16/14  Yes Courtney A Forcucci, PA-C  naproxen (NAPROSYN) 500 MG tablet Take 1 tablet (500 mg total) by mouth 2 (two) times daily with a meal. 07/04/14   Antony Madura, PA-C  traMADol (ULTRAM) 50 MG tablet Take 1 tablet (50 mg total) by mouth every 6 (six) hours as needed. 05/16/14   Courtney A Forcucci, PA-C   BP 156/99 mmHg  Pulse 82  Temp(Src) 97.9 F (36.6 C) (Oral)  Resp 16  SpO2 99%   Physical Exam  Constitutional: He is oriented to person, place, and time. He appears well-developed and well-nourished. No distress.  Nontoxic/nonseptic appearing  HENT:  Head: Normocephalic and atraumatic.  Mouth/Throat: Oropharynx is clear and moist. No oropharyngeal exudate.  Symmetric rise of the uvula with phonation  Eyes: Conjunctivae and EOM are normal. Pupils are equal, round, and reactive to light. No scleral icterus.  Pupils equal round and reactive to direct and consensual light. EOMs normal without nystagmus.  Neck: Normal range of motion.  No nuchal rigidity or meningismus  Cardiovascular: Normal rate, regular rhythm and intact distal pulses.   Pulmonary/Chest: Effort normal. No respiratory distress.  Respirations even and unlabored  Musculoskeletal: Normal range of motion.  Neurological: He is alert and oriented to person, place, and time. No cranial nerve deficit. He exhibits normal muscle tone. Coordination  normal.  GCS 15. Speech is goal oriented. No cranial nerve deficits appreciated; symmetric eyebrow raise, no facial drooping, tongue midline. Patient has equal grip strength bilaterally and 5/5 strength against resistance in all major muscle groups bilaterally. Sensation to light touch intact. Patient ambulatory with  steady gait.  Skin: Skin is warm and dry. No rash noted. He is not diaphoretic. No erythema. No pallor.  Psychiatric: He has a normal mood and affect. His behavior is normal.  Nursing note and vitals reviewed.   ED Course  Procedures (including critical care time) Labs Review Labs Reviewed - No data to display  Imaging Review No results found.   EKG Interpretation None      MDM   Final diagnoses:  Nonintractable headache, unspecified chronicity pattern, unspecified headache type    34 year old male presents to the emergency department for further evaluation of headache. Headache has been intermittent over the last 16 hours. Patient is a nonfocal neurologic exam. No history of head injury or trauma. No fever, nuchal rigidity, or meningismus to suggest meningitis. Patient reports a history of similar headaches.  Doubt emergent intracranial processes cause of symptoms today. Patient ordered to receive Toradol and Reglan, but states that he needs "to be at work in 30 minutes". Patient ordered to, instead, receive naproxen; however, following my exiting of the exam room, patient left the ED stating that he could not wait any longer because he "could not be late to work". Patient ambulated out of the ED in good condition.   Filed Vitals:   07/04/14 0343  BP: 156/99  Pulse: 82  Temp: 97.9 F (36.6 C)  TempSrc: Oral  Resp: 16  SpO2: 99%     Antony MaduraKelly Jibril Mcminn, PA-C 07/04/14 [id number]  Derwood KaplanAnkit Nanavati, MD 07/04/14 [phone]

## 2014-07-04 NOTE — Discharge Instructions (Signed)

## 2014-07-04 NOTE — ED Notes (Signed)
Patient ambulatory from triage with steady gait Patient alert and oriented x 4 Patient with c/o intermittent headache Patient denies N/V, photosensitivity Patient in NAD

## 2015-02-22 ENCOUNTER — Encounter (HOSPITAL_COMMUNITY): Payer: Self-pay | Admitting: Emergency Medicine

## 2015-02-22 ENCOUNTER — Emergency Department (INDEPENDENT_AMBULATORY_CARE_PROVIDER_SITE_OTHER): Payer: Self-pay

## 2015-02-22 ENCOUNTER — Emergency Department (INDEPENDENT_AMBULATORY_CARE_PROVIDER_SITE_OTHER)
Admission: EM | Admit: 2015-02-22 | Discharge: 2015-02-22 | Disposition: A | Discharge status: Home / Self Care | Payer: Self-pay | Source: Home / Self Care

## 2015-02-22 DIAGNOSIS — M25531 Pain in right wrist: Secondary | ICD-10-CM

## 2015-02-22 DIAGNOSIS — R21 Rash and other nonspecific skin eruption: Secondary | ICD-10-CM

## 2015-02-22 LAB — C-REACTIVE PROTEIN: CRP: <0.5 mg/dL (ref <1.0)

## 2015-02-22 LAB — SEDIMENTATION RATE: SED RATE: 0 mm/h (ref 0–16)

## 2015-02-22 MED ORDER — PREDNISONE 10 MG (48) PO TBPK: ORAL_TABLET | Freq: Every day | ORAL | Status: DC

## 2015-02-22 NOTE — Discharge Instructions (Signed)
The cause of your symptoms is not immediately clear. I have a high suspicion that this is related to an autoimmune disease called lupus. Please start the steroids as prescribed. If your symptoms get worse please go to the emergency room. There is no evidence of fracture or other obvious problem with your arm. Please ice the area for additional relief and use ibuprofen 600-800 mg every 6-8 hours.

## 2015-02-22 NOTE — ED Provider Notes (Signed)
CSN: [id number]     Arrival date & time 02/22/15  1846 History   None    Chief Complaint  Patient presents with  . Hand Problem   (Consider location/radiation/quality/duration/timing/severity/associated sxs/prior Treatment) HPI   Right forearm tenderness. Started five days ago. Partially 3 days ago developed swelling of the distal dorsum of the right arm. Tender to palpation. Denies any trauma. Denies any change in work routine or exercise routine. Denies any tick bites, fever, nausea, vomiting, rash, joint swelling. Small abrasion to the hand occurred 1 day ago while changing his brakes. Nothing makes his symptoms better or worse, patient has not tried anything for her symptoms.  Of note patient with diffuse facial rash presently classic malar distribution. States she's had this for years and typically puts ointment on it to help relieve the rash. No diagnoses of lupus. Past Medical History  Diagnosis Date  . Depression   . Dental caries   . Cocaine abuse    History reviewed. No pertinent past surgical history. No family history on file. Social History  Substance Use Topics  . Smoking status: Current Every Day Smoker -- 0.30 packs/day for 15 years    Types: Cigarettes  . Smokeless tobacco: Never Used  . Alcohol Use: Yes     Comment: 3 shots/day, wine red    Review of Systems Per HPI with all other pertinent systems negative.   Allergies  Review of patient's allergies indicates no known allergies.  Home Medications   Prior to Admission medications   Medication Sig Start Date End Date Taking? Authorizing Provider  loperamide (IMODIUM A-D) 2 MG tablet Take 4 mg by mouth 4 (four) times daily as needed for diarrhea or loose stools.    Historical Provider, MD  naproxen (NAPROSYN) 500 MG tablet Take 1 tablet (500 mg total) by mouth 2 (two) times daily with a meal. 07/04/14   Antony Madura, PA-C  omeprazole (PRILOSEC) 20 MG capsule Take 1 capsule (20 mg total) by mouth daily.  05/16/14   Courtney Forcucci, PA-C  predniSONE (STERAPRED UNI-PAK 48 TAB) 10 MG (48) TBPK tablet Take by mouth daily. Take as instructed 02/22/15   Ozella Rocks, MD  traMADol (ULTRAM) 50 MG tablet Take 1 tablet (50 mg total) by mouth every 6 (six) hours as needed. 05/16/14   Terri Piedra, PA-C   Meds Ordered and Administered this Visit  Medications - No data to display  BP 114/80 mmHg  Pulse 83  Temp(Src) 98.1 F (36.7 C) (Oral)  Resp 18  SpO2 100% No data found.   Physical Exam Physical Exam  Constitutional: oriented to person, place, and time. appears well-developed and well-nourished. No distress.  HENT:  Head: Normocephalic and atraumatic.  Eyes: EOMI. PERRL.  Neck: Normal range of motion.  Cardiovascular: RRR, no m/r/g, 2+ distal pulses,  Pulmonary/Chest: Effort normal and breath sounds normal. No respiratory distress.  Abdominal: Soft. Bowel sounds are normal. NonTTP, no distension.  Musculoskeletal: distal dorsum forearm soft tissue swelling    w/ ttp Neurological: alert and oriented to person, place, and time.  Skin: Lasix malar rash.Marland Kitchen  Psychiatric: normal mood and affect. behavior is normal. Judgment and thought content normal.   ED Course  Procedures (including critical care time)  Labs Review Labs Reviewed  SEDIMENTATION RATE  C-REACTIVE PROTEIN  HIV ANTIBODY (ROUTINE TESTING)  RPR    Imaging Review No results found.   Visual Acuity Review  Right Eye Distance:   Left Eye Distance:   Bilateral Distance:  Right Eye Near:   Left Eye Near:    Bilateral Near:         MDM   1. Rash   2. Forearm joint pain, right     Right forearm plain film unrevealing. Etiology not immediately clear. Patient to start using NSAIDs. Several labs were ordered looking for lupus, other forms of infection such as syphilis or HIV. No need for antibiotics as this is unlikely cellulitic. Patient to start a prednisone Dosepak for facial rash which is likely  secondary to a lupus flare and follow-up with rheumatologist.     Ozella Rocksavid J Gilad Dugger, MD 02/22/15 2011

## 2015-02-22 NOTE — ED Notes (Signed)
Points to mid right forearm as location of pain, slight redness to wrist to mid-forearm.  Radial pulse 2 plus.  No known injury.  Small wound on hand, but this occurred yesterday-4 days after the onset of symptoms.

## 2015-02-23 LAB — ANTI-DNA ANTIBODY, DOUBLE-STRANDED: ds DNA Ab: 1 IU/mL (ref 0–9)

## 2015-02-23 LAB — RPR: RPR Ser Ql: NONREACTIVE

## 2015-02-23 LAB — HIV ANTIBODY (ROUTINE TESTING W REFLEX): HIV Screen 4th Generation wRfx: NONREACTIVE

## 2015-02-23 NOTE — ED Notes (Addendum)
C-reactive protein, RPR reports negative

## 2015-07-08 ENCOUNTER — Emergency Department (HOSPITAL_COMMUNITY)
Admission: EM | Admit: 2015-07-08 | Discharge: 2015-07-08 | Disposition: A | Discharge status: Home / Self Care | Payer: Self-pay | Attending: Emergency Medicine | Admitting: Emergency Medicine

## 2015-07-08 ENCOUNTER — Encounter (HOSPITAL_COMMUNITY): Payer: Self-pay | Admitting: Emergency Medicine

## 2015-07-08 ENCOUNTER — Emergency Department (HOSPITAL_COMMUNITY): Payer: Self-pay

## 2015-07-08 DIAGNOSIS — Z791 Long term (current) use of non-steroidal anti-inflammatories (NSAID): Secondary | ICD-10-CM | POA: Insufficient documentation

## 2015-07-08 DIAGNOSIS — Z8719 Personal history of other diseases of the digestive system: Secondary | ICD-10-CM | POA: Insufficient documentation

## 2015-07-08 DIAGNOSIS — Y9289 Other specified places as the place of occurrence of the external cause: Secondary | ICD-10-CM | POA: Insufficient documentation

## 2015-07-08 DIAGNOSIS — S0101XA Laceration without foreign body of scalp, initial encounter: Secondary | ICD-10-CM | POA: Insufficient documentation

## 2015-07-08 DIAGNOSIS — Z79899 Other long term (current) drug therapy: Secondary | ICD-10-CM | POA: Insufficient documentation

## 2015-07-08 DIAGNOSIS — Y9389 Activity, other specified: Secondary | ICD-10-CM | POA: Insufficient documentation

## 2015-07-08 DIAGNOSIS — T148XXA Other injury of unspecified body region, initial encounter: Secondary | ICD-10-CM

## 2015-07-08 DIAGNOSIS — F329 Major depressive disorder, single episode, unspecified: Secondary | ICD-10-CM | POA: Insufficient documentation

## 2015-07-08 DIAGNOSIS — Y999 Unspecified external cause status: Secondary | ICD-10-CM | POA: Insufficient documentation

## 2015-07-08 DIAGNOSIS — F17210 Nicotine dependence, cigarettes, uncomplicated: Secondary | ICD-10-CM | POA: Insufficient documentation

## 2015-07-08 DIAGNOSIS — Z7952 Long term (current) use of systemic steroids: Secondary | ICD-10-CM | POA: Insufficient documentation

## 2015-07-08 MED ORDER — LIDOCAINE-EPINEPHRINE (PF) 2 %-1:200000 IJ SOLN
20.0000 mL | Freq: Once | INTRAMUSCULAR | Status: AC
  Administered 2015-07-08: 20 mL via INTRADERMAL
  Filled 2015-07-08: qty 20

## 2015-07-08 NOTE — ED Notes (Signed)
Pt returned to room from radiology
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
## 2015-07-08 NOTE — Discharge Instructions (Signed)
Laceration Care, Adult °A laceration is a cut that goes through all of the layers of the skin and into the tissue that is right under the skin. Some lacerations heal on their own. Others need to be closed with stitches (sutures), staples, skin adhesive strips, or skin glue. Proper laceration care minimizes the risk of infection and helps the laceration to heal better. °HOW TO CARE FOR YOUR LACERATION °If sutures or staples were used: °· Keep the wound clean and dry. °· If you were given a bandage (dressing), you should change it at least one time per day or as told by your health care provider. You should also change it if it becomes wet or dirty. °· Keep the wound completely dry for the first 24 hours or as told by your health care provider. After that time, you may shower or bathe. However, make sure that the wound is not soaked in water until after the sutures or staples have been removed. °· Clean the wound one time each day or as told by your health care provider: °· Wash the wound with soap and water. °· Rinse the wound with water to remove all soap. °· Pat the wound dry with a clean towel. Do not rub the wound. °· After cleaning the wound, apply a thin layer of antibiotic ointment as told by your health care provider. This will help to prevent infection and keep the dressing from sticking to the wound. °· Have the sutures or staples removed as told by your health care provider. °If skin adhesive strips were used: °· Keep the wound clean and dry. °· If you were given a bandage (dressing), you should change it at least one time per day or as told by your health care provider. You should also change it if it becomes dirty or wet. °· Do not get the skin adhesive strips wet. You may shower or bathe, but be careful to keep the wound dry. °· If the wound gets wet, pat it dry with a clean towel. Do not rub the wound. °· Skin adhesive strips fall off on their own. You may trim the strips as the wound heals. Do not  remove skin adhesive strips that are still stuck to the wound. They will fall off in time. °If skin glue was used: °· Try to keep the wound dry, but you may briefly wet it in the shower or bath. Do not soak the wound in water, such as by swimming. °· After you have showered or bathed, gently pat the wound dry with a clean towel. Do not rub the wound. °· Do not do any activities that will make you sweat heavily until the skin glue has fallen off on its own. °· Do not apply liquid, cream, or ointment medicine to the wound while the skin glue is in place. Using those may loosen the film before the wound has healed. °· If you were given a bandage (dressing), you should change it at least one time per day or as told by your health care provider. You should also change it if it becomes dirty or wet. °· If a dressing is placed over the wound, be careful not to apply tape directly over the skin glue. Doing that may cause the glue to be pulled off before the wound has healed. °· Do not pick at the glue. The skin glue usually remains in place for 5-10 days, then it falls off of the skin. °General Instructions °· Take over-the-counter and prescription   medicines only as told by your health care provider. °· If you were prescribed an antibiotic medicine or ointment, take or apply it as told by your doctor. Do not stop using it even if your condition improves. °· To help prevent scarring, make sure to cover your wound with sunscreen whenever you are outside after stitches are removed, after adhesive strips are removed, or when glue remains in place and the wound is healed. Make sure to wear a sunscreen of at least 30 SPF. °· Do not scratch or pick at the wound. °· Keep all follow-up visits as told by your health care provider. This is important. °· Check your wound every day for signs of infection. Watch for: °· Redness, swelling, or pain. °· Fluid, blood, or pus. °· Raise (elevate) the injured area above the level of your heart  while you are sitting or lying down, if possible. °SEEK MEDICAL CARE IF: °· You received a tetanus shot and you have swelling, severe pain, redness, or bleeding at the injection site. °· You have a fever. °· A wound that was closed breaks open. °· You notice a bad smell coming from your wound or your dressing. °· You notice something coming out of the wound, such as wood or glass. °· Your pain is not controlled with medicine. °· You have increased redness, swelling, or pain at the site of your wound. °· You have fluid, blood, or pus coming from your wound. °· You notice a change in the color of your skin near your wound. °· You need to change the dressing frequently due to fluid, blood, or pus draining from the wound. °· You develop a new rash. °· You develop numbness around the wound. °SEEK IMMEDIATE MEDICAL CARE IF: °· You develop severe swelling around the wound. °· Your pain suddenly increases and is severe. °· You develop painful lumps near the wound or on skin that is anywhere on your body. °· You have a red streak going away from your wound. °· The wound is on your hand or foot and you cannot properly move a finger or toe. °· The wound is on your hand or foot and you notice that your fingers or toes look pale or bluish. °  °This information is not intended to replace advice given to you by your health care provider. Make sure you discuss any questions you have with your health care provider. °  °Document Released: 04/29/2005 Document Revised: 09/13/2014 Document Reviewed: 04/25/2014 °[employer]vier Interactive Patient Education ©2016 [employer] °Contusion °A contusion is a deep bruise. Contusions are the result of a blunt injury to tissues and muscle fibers under the skin. The injury causes bleeding under the skin. The skin overlying the contusion may turn blue, purple, or yellow. Minor injuries will give you a painless contusion, but more severe contusions may stay painful and swollen for a few weeks.  °CAUSES    °This condition is usually caused by a blow, trauma, or direct force to an area of the body. °SYMPTOMS  °Symptoms of this condition include: °· Swelling of the injured area. °· Pain and tenderness in the injured area. °· Discoloration. The area may have redness and then turn blue, purple, or yellow. °DIAGNOSIS  °This condition is diagnosed based on a physical exam and medical history. An X-ray, CT scan, or MRI may be needed to determine if there are any associated injuries, such as broken bones (fractures). °TREATMENT  °Specific treatment for this condition depends on what area of   the body was injured. In general, the best treatment for a contusion is resting, icing, applying pressure to (compression), and elevating the injured area. This is often called the RICE strategy. Over-the-counter anti-inflammatory medicines may also be recommended for pain control.  °HOME CARE INSTRUCTIONS  °· Rest the injured area. °· If directed, apply ice to the injured area: °¨ Put ice in a plastic bag. °¨ Place a towel between your skin and the bag. °¨ Leave the ice on for 20 minutes, 2-3 times per day. °· If directed, apply light compression to the injured area using an elastic bandage. Make sure the bandage is not wrapped too tightly. Remove and reapply the bandage as directed by your health care provider. °· If possible, raise (elevate) the injured area above the level of your heart while you are sitting or lying down. °· Take over-the-counter and prescription medicines only as told by your health care provider. °SEEK MEDICAL CARE IF: °· Your symptoms do not improve after several days of treatment. °· Your symptoms get worse. °· You have difficulty moving the injured area. °SEEK IMMEDIATE MEDICAL CARE IF:  °· You have severe pain. °· You have numbness in a hand or foot. °· Your hand or foot turns pale or cold. °  °This information is not intended to replace advice given to you by your health care provider. Make sure you discuss  any questions you have with your health care provider. °  °Document Released: 02/06/2005 Document Revised: 01/18/2015 Document Reviewed: 09/14/2014 °Elsevier Interactive Patient Education ©2016 [employer] ° °

## 2015-07-08 NOTE — ED Notes (Signed)
[place] at bedside
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
## 2015-07-08 NOTE — ED Notes (Signed)
Pt stable, ambulatory, states understanding of discharge instructions 
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
## 2017-01-29 ENCOUNTER — Encounter (HOSPITAL_COMMUNITY): Payer: Self-pay | Admitting: Emergency Medicine

## 2017-01-29 ENCOUNTER — Emergency Department (HOSPITAL_COMMUNITY)
Admission: EM | Admit: 2017-01-29 | Discharge: 2017-01-29 | Disposition: A | Discharge status: Home / Self Care | Payer: Self-pay | Attending: Emergency Medicine | Admitting: Emergency Medicine

## 2017-01-29 DIAGNOSIS — N342 Other urethritis: Secondary | ICD-10-CM | POA: Insufficient documentation

## 2017-01-29 DIAGNOSIS — F1910 Other psychoactive substance abuse, uncomplicated: Secondary | ICD-10-CM | POA: Insufficient documentation

## 2017-01-29 DIAGNOSIS — F17210 Nicotine dependence, cigarettes, uncomplicated: Secondary | ICD-10-CM | POA: Insufficient documentation

## 2017-01-29 MED ORDER — CEFTRIAXONE SODIUM 250 MG IJ SOLR
250.0000 mg | Freq: Once | INTRAMUSCULAR | Status: AC
  Administered 2017-01-29: 250 mg via INTRAMUSCULAR
  Filled 2017-01-29: qty 250

## 2017-01-29 MED ORDER — STERILE WATER FOR INJECTION IJ SOLN
INTRAMUSCULAR | Status: AC
  Administered 2017-01-29: 1.2 mL
  Filled 2017-01-29: qty 10

## 2017-01-29 MED ORDER — DOXYCYCLINE HYCLATE 100 MG PO CAPS: 100.0000 mg | ORAL_CAPSULE | Freq: Two times a day (BID) | ORAL | 0 refills | Status: DC

## 2017-01-29 MED ORDER — AZITHROMYCIN 250 MG PO TABS
1000.0000 mg | ORAL_TABLET | Freq: Once | ORAL | Status: AC
  Administered 2017-01-29: 1000 mg via ORAL
  Filled 2017-01-29: qty 4

## 2017-01-29 NOTE — ED Triage Notes (Signed)
Pt states he needs detox from cocaine and molly  Pt states he binge uses and last used 2 days ago  Pt also wants to be checked for STD  States he has some creamy colored discharge from his penis that started yesterday

## 2017-01-29 NOTE — ED Notes (Signed)
ED Provider at bedside. 

## 2017-01-29 NOTE — ED Notes (Signed)
Bed: WTR6 Expected date:  Expected time:  Means of arrival:  Comments: 

## 2017-01-29 NOTE — ED Provider Notes (Signed)
[facility]-EMERGENCY DEPT Provider Note   CSN: [id number] Arrival date & time: 01/29/17  0254     History   Chief Complaint Chief Complaint  Patient presents with  . detox  . STD check    HPI Thomas Cobb is a 36 y.o. male.  Patient presents with multiple complaints. He has a penile discharge that started yesterday. He reports minimal dysuria. No fever, testicular pain or scrotal swelling. No nausea vomiting or abdominal pain.  He also admits to cocaine and other substance abuse and is requesting detox/admission for addiction. No SI/HI/AVH.   The history is provided by the patient. No language interpreter was used.    Past Medical History:  Diagnosis Date  . Cocaine abuse   . Dental caries   . Depression     Patient Active Problem List   Diagnosis Date Noted  . PTSD (post-traumatic stress disorder) 12/28/2012  . Cocaine abuse with cocaine-induced mood disorder (HCC) 12/26/2012  . Alcohol dependence (HCC) 12/26/2012  . Alcohol abuse 12/26/2012    History reviewed. No pertinent surgical history.     Home Medications    Prior to Admission medications   Medication Sig Start Date End Date Taking? Authorizing Provider  naproxen (NAPROSYN) 500 MG tablet Take 1 tablet (500 mg total) by mouth 2 (two) times daily with a meal. Patient not taking: Reported on 01/29/2017 07/04/14   Antony Madura, PA-C  omeprazole (PRILOSEC) 20 MG capsule Take 1 capsule (20 mg total) by mouth daily. Patient not taking: Reported on 01/29/2017 05/16/14   Forcucci, Toni Amend, PA-C  predniSONE (STERAPRED UNI-PAK 48 TAB) 10 MG (48) TBPK tablet Take by mouth daily. Take as instructed Patient not taking: Reported on 01/29/2017 02/22/15   Ozella Rocks, MD  traMADol (ULTRAM) 50 MG tablet Take 1 tablet (50 mg total) by mouth every 6 (six) hours as needed. Patient not taking: Reported on 01/29/2017 05/16/14   Terri Piedra, PA-C    Family History Family History  Problem Relation Age of Onset  .  Diabetes Mother     Social History Social History  Substance Use Topics  . Smoking status: Current Every Day Smoker    Packs/day: 0.50    Years: 15.00    Types: Cigarettes  . Smokeless tobacco: Never Used  . Alcohol use Yes     Comment: a few beers per week     Allergies   Patient has no known allergies.   Review of Systems Review of Systems  Constitutional: Negative for chills and fever.  Respiratory: Negative.   Cardiovascular: Negative.   Gastrointestinal: Negative.   Genitourinary: Positive for discharge and dysuria. Negative for penile swelling, scrotal swelling and testicular pain.  Musculoskeletal: Negative.   Skin: Negative.   Neurological: Negative.      Physical Exam Updated Vital Signs There were no vitals taken for this visit.  Physical Exam  Constitutional: He is oriented to person, place, and time. He appears well-developed and well-nourished.  Neck: Normal range of motion.  Pulmonary/Chest: Effort normal.  Abdominal: There is no tenderness.  Genitourinary:  Genitourinary Comments: Purulent discharge at penile meatus. No testicular tenderness or swelling.   Musculoskeletal: Normal range of motion.  Lymphadenopathy:       Right: Inguinal adenopathy present.       Left: Inguinal adenopathy present.  Neurological: He is alert and oriented to person, place, and time.  Skin: Skin is warm and dry.  Psychiatric: He has a normal mood and affect.     ED  Treatments / Results  Labs (all labs ordered are listed, but only abnormal results are displayed) Labs Reviewed - No data to display  EKG  EKG Interpretation None       Radiology No results found.  Procedures Procedures (including critical care time)  Medications Ordered in ED Medications  azithromycin (ZITHROMAX) tablet 1,000 mg (not administered)  cefTRIAXone (ROCEPHIN) injection 250 mg (not administered)     Initial Impression / Assessment and Plan / ED Course  I have reviewed the  triage vital signs and the nursing notes.  Pertinent labs & imaging results that were available during my care of the patient were reviewed by me and considered in my medical decision making (see chart for details).     Patient will be treated for STD. He reports being tested 2 months ago for HIV, RPR and declines these tests. Will tx with 7 days Doxycycline.  Discussed resources available for substance abuse and dependence. He reiterates no HI/SI/AVH. He is stable for discharge home.   Final Clinical Impressions(s) / ED Diagnoses   Final diagnoses:  None   1. Urethritis 2. Polysubstance abuse New Prescriptions New Prescriptions   No medications on file     Elpidio Anis, Cordelia Poche 01/29/17 0865    Ward, Layla Maw, DO 01/29/17 [phone]

## 2017-01-30 LAB — GC/CHLAMYDIA PROBE AMP (~~LOC~~) NOT AT ARMC
Chlamydia: NEGATIVE
Neisseria Gonorrhea: POSITIVE — AB

## 2017-10-09 ENCOUNTER — Emergency Department (HOSPITAL_COMMUNITY)
Admission: EM | Admit: 2017-10-09 | Discharge: 2017-10-09 | Disposition: A | Discharge status: Home / Self Care | Payer: Self-pay | Attending: Emergency Medicine | Admitting: Emergency Medicine

## 2017-10-09 ENCOUNTER — Encounter (HOSPITAL_COMMUNITY): Payer: Self-pay | Admitting: *Deleted

## 2017-10-09 DIAGNOSIS — K029 Dental caries, unspecified: Secondary | ICD-10-CM | POA: Insufficient documentation

## 2017-10-09 DIAGNOSIS — K0889 Other specified disorders of teeth and supporting structures: Secondary | ICD-10-CM | POA: Insufficient documentation

## 2017-10-09 MED ORDER — IBUPROFEN 200 MG PO TABS
400.0000 mg | ORAL_TABLET | Freq: Once | ORAL | Status: AC | PRN
  Administered 2017-10-09: 400 mg via ORAL
  Filled 2017-10-09: qty 2

## 2017-10-09 MED ORDER — MELOXICAM 15 MG PO TABS: 15.0000 mg | ORAL_TABLET | Freq: Every day | ORAL | 0 refills | Status: DC

## 2017-10-09 MED ORDER — AMOXICILLIN 500 MG PO CAPS: 500.0000 mg | ORAL_CAPSULE | Freq: Three times a day (TID) | ORAL | 0 refills | Status: DC

## 2017-10-09 NOTE — Discharge Instructions (Addendum)
[employer]  °[facility] [employer]  °[facility]  °[address]  °[place], [place] [address]  °Phone [phone]  °[facility] [employer] [facility] [place], [place], exemplifies the Dental School’s vision to improve the health and quality of life of all [place]ians by creating leaders with a passion to care for the underserved and by leading the nation in community-based, service learning oral health education. °We are committed to offering comprehensive general dental services for adults, children and special needs patients in a safe, caring and professional setting. ° °Appointments: Our clinic is open Monday through Friday 8:00 a.m. until 5:00 p.m. The amount of time scheduled for an appointment depends on the patient’s specific needs. We ask that you keep your appointed time for care or provide 24-hour notice of all appointment changes. Parents or legal guardians must accompany minor children. ° °Payment for Services: Medicaid and other insurance plans are welcome. Payment for services is due when services are rendered and may be made by cash or credit card. If you have dental insurance, we will assist you with your claim submission.  ° °Emergencies: Emergency services will be provided Monday through Friday on a walk-in basis. Please arrive early for emergency services. After hours emergency services will be provided for patients of record as required. ° °Services:  °Comprehensive General Dentistry  °Children’s Dentistry  °Oral Surgery - Extractions  °Root Canals  °Sealants and Tooth Colored Fillings  °Crowns and Bridges  °Dentures and Partial Dentures  °Implant Services  °Periodontal Services and Cleanings  °Cosmetic Tooth Whitening  °Digital Radiography  °3-D/Cone Beam Imaging ° ° °
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
## 2017-10-09 NOTE — ED Provider Notes (Signed)
[facility] Note   CSN: [id number] Arrival date & time: 10/09/17  1742     History   Chief Complaint Chief Complaint  Patient presents with  . Facial Pain  . Dental Pain    HPI Thomas Cobb is a 37 y.o. male presents for evaluation of dental pain.  Patient has a right upper molar that is broken and decayed past the gumline.  It has been aching for the past several weeks however has become significantly more painful.  Patient states that he does not want pain medication he just wants some antibiotics and anti-inflammatories.  He denies any difficulty swallowing, changes in speech, fevers or chills.  HPI  Past Medical History:  Diagnosis Date  . Cocaine abuse (HCC)   . Dental caries   . Depression     Patient Active Problem List   Diagnosis Date Noted  . PTSD (post-traumatic stress disorder) 12/28/2012  . Cocaine abuse with cocaine-induced mood disorder (HCC) 12/26/2012  . Alcohol dependence (HCC) 12/26/2012  . Alcohol abuse 12/26/2012    History reviewed. No pertinent surgical history.      Home Medications    Prior to Admission medications   Medication Sig Start Date End Date Taking? Authorizing Provider  doxycycline (VIBRAMYCIN) 100 MG capsule Take 1 capsule (100 mg total) by mouth 2 (two) times daily. 01/29/17   Elpidio Anis, PA-C  naproxen (NAPROSYN) 500 MG tablet Take 1 tablet (500 mg total) by mouth 2 (two) times daily with a meal. Patient not taking: Reported on 01/29/2017 07/04/14   Antony Madura, PA-C  omeprazole (PRILOSEC) 20 MG capsule Take 1 capsule (20 mg total) by mouth daily. Patient not taking: Reported on 01/29/2017 05/16/14   Forcucci, Toni Amend, PA-C  predniSONE (STERAPRED UNI-PAK 48 TAB) 10 MG (48) TBPK tablet Take by mouth daily. Take as instructed Patient not taking: Reported on 01/29/2017 02/22/15   Ozella Rocks, MD  traMADol (ULTRAM) 50 MG tablet Take 1 tablet (50 mg total) by mouth every 6 (six)  hours as needed. Patient not taking: Reported on 01/29/2017 05/16/14   Terri Piedra, PA-C    Family History Family History  Problem Relation Age of Onset  . Diabetes Mother     Social History Social History   Tobacco Use  . Smoking status: Current Every Day Smoker    Packs/day: 0.50    Years: 15.00    Pack years: 7.50    Types: Cigarettes  . Smokeless tobacco: Never Used  Substance Use Topics  . Alcohol use: Yes    Comment: a few beers per week  . Drug use: Yes    Types: Cocaine    Comment: cocaine and molly     Allergies   Patient has no known allergies.   Review of Systems Review of Systems Ten systems reviewed and are negative for acute change, except as noted in the HPI.    Physical Exam Updated Vital Signs BP (!) 140/95 (BP Location: Right Arm)   Pulse 72   Temp 97.7 F (36.5 C) (Oral)   Resp 18   SpO2 98%   Physical Exam  Constitutional: He appears well-developed and well-nourished. No distress.  HENT:  Head: Normocephalic and atraumatic.  Right upper third molar fractured down to the gumline with significant dental decay and blackened dental carie formation.  No overt abscess, uvula midline  Eyes: Conjunctivae are normal. No scleral icterus.  Neck: Normal range of motion. Neck supple.  Cardiovascular: Normal rate, regular  rhythm and normal heart sounds.  Pulmonary/Chest: Effort normal and breath sounds normal. No respiratory distress.  Abdominal: Soft. There is no tenderness.  Musculoskeletal: He exhibits no edema.  Neurological: He is alert.  Skin: Skin is warm and dry. He is not diaphoretic.  Psychiatric: His behavior is normal.  Nursing note and vitals reviewed.    ED Treatments / Results  Labs (all labs ordered are listed, but only abnormal results are displayed) Labs Reviewed - No data to display  EKG None  Radiology No results found.  Procedures Procedures (including critical care time)  Medications Ordered in  ED Medications  ibuprofen (ADVIL,MOTRIN) tablet 400 mg (400 mg Oral Given 10/09/17 1755)     Initial Impression / Assessment and Plan / ED Course  I have reviewed the triage vital signs and the nursing notes.  Pertinent labs & imaging results that were available during my care of the patient were reviewed by me and considered in my medical decision making (see chart for details).    Patient with dentalgia.  No abscess requiring immediate incision and drainage.  Exam not concerning for Ludwig's angina or pharyngeal abscess. . Pt instructed to follow-up with dentist.  Discussed return precautions. Pt safe for discharge.   Final Clinical Impressions(s) / ED Diagnoses   Final diagnoses:  None    ED Discharge Orders    None       Arthor Captain, PA-C 10/09/17 1853    Charlynne Pander, MD 10/09/17 [phone]

## 2017-11-20 ENCOUNTER — Other Ambulatory Visit: Payer: Self-pay

## 2017-11-20 ENCOUNTER — Emergency Department (HOSPITAL_COMMUNITY)
Admission: EM | Admit: 2017-11-20 | Discharge: 2017-11-20 | Disposition: A | Discharge status: Home / Self Care | Payer: No Typology Code available for payment source | Attending: Emergency Medicine | Admitting: Emergency Medicine

## 2017-11-20 ENCOUNTER — Encounter (HOSPITAL_COMMUNITY): Payer: Self-pay | Admitting: Emergency Medicine

## 2017-11-20 DIAGNOSIS — M25521 Pain in right elbow: Secondary | ICD-10-CM | POA: Insufficient documentation

## 2017-11-20 DIAGNOSIS — E876 Hypokalemia: Secondary | ICD-10-CM

## 2017-11-20 DIAGNOSIS — F17210 Nicotine dependence, cigarettes, uncomplicated: Secondary | ICD-10-CM | POA: Insufficient documentation

## 2017-11-20 DIAGNOSIS — Z79899 Other long term (current) drug therapy: Secondary | ICD-10-CM | POA: Diagnosis not present

## 2017-11-20 DIAGNOSIS — M79601 Pain in right arm: Secondary | ICD-10-CM

## 2017-11-20 LAB — BASIC METABOLIC PANEL
ANION GAP: 7 (ref 5–15)
BUN: 13 mg/dL (ref 6–20)
CHLORIDE: 104 mmol/L (ref 98–111)
CO2: 29 mmol/L (ref 22–32)
Calcium: 8.9 mg/dL (ref 8.9–10.3)
Creatinine, Ser: 0.95 mg/dL (ref 0.61–1.24)
GFR calc non Af Amer: >60 mL/min (ref >60)
GLUCOSE: 166 mg/dL — AB (ref 70–99)
Potassium: 3.3 mmol/L — ABNORMAL LOW (ref 3.5–5.1)
Sodium: 140 mmol/L (ref 135–145)

## 2017-11-20 LAB — URINALYSIS, ROUTINE W REFLEX MICROSCOPIC
BACTERIA UA: NONE SEEN
BILIRUBIN URINE: NEGATIVE
GLUCOSE, UA: NEGATIVE mg/dL
Hgb urine dipstick: NEGATIVE
KETONES UR: NEGATIVE mg/dL
NITRITE: NEGATIVE
Protein, ur: NEGATIVE mg/dL
Specific Gravity, Urine: 1.009 (ref 1.005–1.030)
pH: 6 (ref 5.0–8.0)

## 2017-11-20 LAB — CBC WITH DIFFERENTIAL/PLATELET
BASOS ABS: 0 10*3/uL (ref 0.0–0.1)
BASOS PCT: 1 %
Eosinophils Absolute: 0.3 10*3/uL (ref 0.0–0.7)
Eosinophils Relative: 4 %
HEMATOCRIT: 41.4 % (ref 39.0–52.0)
HEMOGLOBIN: 15.3 g/dL (ref 13.0–17.0)
LYMPHS ABS: 2.1 10*3/uL (ref 0.7–4.0)
LYMPHS PCT: 33 %
MCH: 31 pg (ref 26.0–34.0)
MCHC: 37 g/dL — ABNORMAL HIGH (ref 30.0–36.0)
MCV: 83.8 fL (ref 78.0–100.0)
MONO ABS: 0.4 10*3/uL (ref 0.1–1.0)
Monocytes Relative: 6 %
NEUTROS PCT: 56 %
Neutro Abs: 3.6 10*3/uL (ref 1.7–7.7)
Platelets: 198 10*3/uL (ref 150–400)
RBC: 4.94 MIL/uL (ref 4.22–5.81)
RDW: 13 % (ref 11.5–15.5)
WBC: 6.5 10*3/uL (ref 4.0–10.5)

## 2017-11-20 LAB — CK: Total CK: 232 U/L (ref 49–397)

## 2017-11-20 MED ORDER — IBUPROFEN 200 MG PO TABS
600.0000 mg | ORAL_TABLET | Freq: Once | ORAL | Status: AC
  Administered 2017-11-20: 600 mg via ORAL
  Filled 2017-11-20: qty 3
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
## 2017-11-20 NOTE — Discharge Instructions (Signed)
You may alternate taking Tylenol and Ibuprofen as needed for pain control. You may take 400-600 mg of ibuprofen every 6 hours and [phone] mg of Tylenol every 6 hours. Do not exceed 4000 mg of Tylenol daily as this can lead to liver damage. Also, make sure to take Ibuprofen with meals as it can cause an upset stomach. Do not take other NSAIDs while taking Ibuprofen such as (Aleve, Naprosyn, Aspirin, Celebrex, etc) and do not take more than the prescribed dose as this can lead to ulcers and bleeding in your GI tract. You may use warm and cold compresses to help with your symptoms.   Please follow up with your primary doctor within the next 7-10 days for re-evaluation and further treatment of your symptoms. You should call the hand surgeon to make an appointment for follow up.  Please return to the ER sooner if you have any new or worsening symptoms.

## 2017-11-20 NOTE — ED Triage Notes (Signed)
Pt complaint of right elbow pain post shocked yesterday. Pain worse at night.

## 2017-11-20 NOTE — ED Notes (Signed)
ED Provider at bedside. 

## 2017-12-04 ENCOUNTER — Emergency Department (HOSPITAL_COMMUNITY)
Admission: EM | Admit: 2017-12-04 | Discharge: 2017-12-04 | Disposition: A | Discharge status: Home / Self Care | Payer: Self-pay | Attending: Emergency Medicine | Admitting: Emergency Medicine

## 2017-12-04 ENCOUNTER — Encounter (HOSPITAL_COMMUNITY): Payer: Self-pay

## 2017-12-04 DIAGNOSIS — F17210 Nicotine dependence, cigarettes, uncomplicated: Secondary | ICD-10-CM | POA: Insufficient documentation

## 2017-12-04 DIAGNOSIS — K047 Periapical abscess without sinus: Secondary | ICD-10-CM | POA: Insufficient documentation

## 2017-12-04 DIAGNOSIS — K029 Dental caries, unspecified: Secondary | ICD-10-CM | POA: Insufficient documentation

## 2017-12-04 MED ORDER — AMOXICILLIN 500 MG PO CAPS: 500.0000 mg | ORAL_CAPSULE | Freq: Three times a day (TID) | ORAL | 0 refills | Status: DC

## 2017-12-04 MED ORDER — NAPROXEN 500 MG PO TABS: 500.0000 mg | ORAL_TABLET | Freq: Two times a day (BID) | ORAL | 0 refills | Status: DC

## 2017-12-04 NOTE — ED Triage Notes (Signed)
Pt presents with 1 month h/o R lower, back tooth pain.  Pt reports tooth has hole in it, taking OTC medication that is not working.

## 2017-12-04 NOTE — Discharge Instructions (Addendum)
Follow-up with a dentist as soon as possible.

## 2017-12-04 NOTE — ED Provider Notes (Signed)
[name] EMERGENCY DEPARTMENT Provider Note   CSN: [id number] Arrival date & time: 12/04/17  1648     History   Chief Complaint Chief Complaint  Patient presents with  . Dental Pain    HPI Thomas Cobb is a 37 y.o. male who presents to the ED with dental pain. Patient reports that the pain has been off and on for the past month due to a broken and decayed tooth. Taking OTC medications without relief.   HPI  Past Medical History:  Diagnosis Date  . Cocaine abuse (HCC)   . Dental caries   . Depression     Patient Active Problem List   Diagnosis Date Noted  . PTSD (post-traumatic stress disorder) 12/28/2012  . Cocaine abuse with cocaine-induced mood disorder (HCC) 12/26/2012  . Alcohol dependence (HCC) 12/26/2012  . Alcohol abuse 12/26/2012    History reviewed. No pertinent surgical history.      Home Medications    Prior to Admission medications   Medication Sig Start Date End Date Taking? Authorizing Provider  amoxicillin (AMOXIL) 500 MG capsule Take 1 capsule (500 mg total) by mouth 3 (three) times daily. 12/04/17   Janne Napoleon, NP  Multiple Vitamin (MULTIVITAMIN WITH MINERALS) TABS tablet Take 2 tablets by mouth daily.    [provider]  naproxen (NAPROSYN) 500 MG tablet Take 1 tablet (500 mg total) by mouth 2 (two) times daily. 12/04/17   Janne Napoleon, NP    Family History Family History  Problem Relation Age of Onset  . Diabetes Mother     Social History Social History   Tobacco Use  . Smoking status: Current Every Day Smoker    Packs/day: 0.50    Years: 15.00    Pack years: 7.50    Types: Cigarettes  . Smokeless tobacco: Never Used  Substance Use Topics  . Alcohol use: Yes    Comment: a few beers per week  . Drug use: Yes    Types: Cocaine    Comment: cocaine and molly     Allergies   Patient has no known allergies.   Review of Systems Review of Systems  HENT: Positive for dental problem and ear  pain.   Hematological: Positive for adenopathy.  All other systems reviewed and are negative.    Physical Exam Updated Vital Signs BP 125/87 (BP Location: Right Arm)   Pulse 74   Temp 98.4 F (36.9 C) (Oral)   Resp 18   Ht 6\' 2"  (1.88 m)   Wt 70.3 kg (155 lb)   SpO2 97%   BMI 19.90 kg/m   Physical Exam  Constitutional: He appears well-developed and well-nourished. No distress.  HENT:  Right Ear: Tympanic membrane normal.  Left Ear: Tympanic membrane normal.  Mouth/Throat: Uvula is midline and oropharynx is clear and moist. Dental caries present.    Eyes: EOM are normal.  Neck: Neck supple.  Cardiovascular: Normal rate.  Pulmonary/Chest: Effort normal.  Musculoskeletal: Normal range of motion.  Lymphadenopathy:    He has cervical adenopathy.  Neurological: He is alert.  Skin: Skin is warm and dry.  Psychiatric: He has a normal mood and affect.  Nursing note and vitals reviewed.    ED Treatments / Results  Labs (all labs ordered are listed, but only abnormal results are displayed) Labs Reviewed - No data to display  Radiology No results found.  Procedures Procedures (including critical care time)  Medications Ordered in ED Medications - No data to  display   Initial Impression / Assessment and Plan / ED Course  I have reviewed the triage vital signs and the nursing notes. Patient with toothache.  No gross abscess.  Exam unconcerning for Ludwig's angina or spread of infection.  Will treat with amoxicillin and anti-inflammatories medicine.  Urged patient to follow-up with dentist.    Final Clinical Impressions(s) / ED Diagnoses   Final diagnoses:  Dental infection  Dental caries    ED Discharge Orders        Ordered    amoxicillin (AMOXIL) 500 MG capsule  3 times daily,   Status:  Discontinued     12/04/17 1716    naproxen (NAPROSYN) 500 MG tablet  2 times daily,   Status:  Discontinued     12/04/17 1716    amoxicillin (AMOXIL) 500 MG capsule  3  times daily     12/04/17 1734    naproxen (NAPROSYN) 500 MG tablet  2 times daily     12/04/17 1734       Damian Leavelleese, [place] Alto[place] M, NP 12/04/17 1736    Shaune PollackIsaacs, Cameron, MD 12/04/17 2247

## 2018-02-07 ENCOUNTER — Encounter (HOSPITAL_COMMUNITY): Payer: Self-pay

## 2018-02-07 ENCOUNTER — Other Ambulatory Visit: Payer: Self-pay

## 2018-02-07 ENCOUNTER — Ambulatory Visit (HOSPITAL_COMMUNITY)
Admission: EM | Admit: 2018-02-07 | Discharge: 2018-02-07 | Disposition: A | Discharge status: Home / Self Care | Payer: Self-pay | Attending: Family Medicine | Admitting: Family Medicine

## 2018-02-07 DIAGNOSIS — H109 Unspecified conjunctivitis: Secondary | ICD-10-CM

## 2018-02-07 MED ORDER — POLYMYXIN B-TRIMETHOPRIM 10000-0.1 UNIT/ML-% OP SOLN: 1.0000 [drp] | Freq: Four times a day (QID) | OPHTHALMIC | 0 refills | Status: AC

## 2018-02-07 NOTE — ED Provider Notes (Signed)
[facility]   [id number] 02/07/18 Arrival Time: 1051  CC: EYE redness  SUBJECTIVE:  Thomas Cobb is a 37 y.o. male who presents with complaint of right eye redness that has spread to left eye that began abruptly last night.  Denies a precipitating event, trauma, or personal contact with bacterial conjunctivitis. Complains of associated itching, but not pain.  Has NOT tried OTC eye drops without relief.  Denies aggravating factors.  Complains of associated eye discharge and drainage.  Denies eye pain, vision changes, double vision, FB sensation.     Denies contact lens use.    ROS: As per HPI.  Past Medical History:  Diagnosis Date  . Cocaine abuse (HCC)   . Dental caries   . Depression    History reviewed. No pertinent surgical history. No Known Allergies No current facility-administered medications on file prior to encounter.    Current Outpatient Medications on File Prior to Encounter  Medication Sig Dispense Refill  . amoxicillin (AMOXIL) 500 MG capsule Take 1 capsule (500 mg total) by mouth 3 (three) times daily. 30 capsule 0  . Multiple Vitamin (MULTIVITAMIN WITH MINERALS) TABS tablet Take 2 tablets by mouth daily.    . naproxen (NAPROSYN) 500 MG tablet Take 1 tablet (500 mg total) by mouth 2 (two) times daily. 20 tablet 0   Social History   Socioeconomic History  . Marital status: Single    Spouse name: Not on file  . Number of children: Not on file  . Years of education: Not on file  . Highest education level: Not on file  Occupational History  . Not on file  Social Needs  . Financial resource strain: Not on file  . Food insecurity:    Worry: Not on file    Inability: Not on file  . Transportation needs:    Medical: Not on file    Non-medical: Not on file  Tobacco Use  . Smoking status: Current Every Day Smoker    Packs/day: 0.50    Years: 15.00    Pack years: 7.50    Types: Cigarettes  . Smokeless tobacco: Never Used  Substance and  Sexual Activity  . Alcohol use: Yes    Comment: a few beers per week  . Drug use: Yes    Types: Cocaine    Comment: cocaine and molly  . Sexual activity: Not on file  Lifestyle  . Physical activity:    Days per week: Not on file    Minutes per session: Not on file  . Stress: Not on file  Relationships  . Social connections:    Talks on phone: Not on file    Gets together: Not on file    Attends religious service: Not on file    Active member of club or organization: Not on file    Attends meetings of clubs or organizations: Not on file    Relationship status: Not on file  . Intimate partner violence:    Fear of current or ex partner: Not on file    Emotionally abused: Not on file    Physically abused: Not on file    Forced sexual activity: Not on file  Other Topics Concern  . Not on file  Social History Narrative  . Not on file   Family History  Problem Relation Age of Onset  . Diabetes Mother     OBJECTIVE:  Vitals:   02/07/18 1132 02/07/18 1134  BP:  129/86  Pulse:  67  Resp:  18  Temp:  97.9 F (36.6 C)  TempSrc:  Oral  SpO2:  100%  Weight: 155 lb (70.3 kg)     General appearance: alert; no distress Eyes: conjunctiva: 1+ bacterial conjunctivitis Neck: supple Lungs: clear to auscultation bilaterally Heart: regular rate and rhythm Skin: warm and dry Psychological: alert and cooperative; normal mood and affect   ASSESSMENT & PLAN:  1. Bacterial conjunctivitis of both eyes     Meds ordered this encounter  Medications  . trimethoprim-polymyxin b (POLYTRIM) ophthalmic solution    Sig: Place 1 drop into both eyes 4 (four) times daily for 7 days.    Dispense:  10 mL    Refill:  0    Order Specific Question:   Supervising Provider    Answer:   Isa Rankin [[id number]]   Use eye drops as prescribed and to completion Wash pillow cases, wash hands regularly with soap and water, avoid touching your face and eyes, wash door handles, light switches,  remotes and other objects you frequently touch Return or follow up with PCP if symptoms persists  Return or go to the ED if you have any new or worsening symptoms such as eye pain, worsening symptoms, vision changes, etc...  Reviewed expectations re: course of current medical issues. Questions answered. Outlined signs and symptoms indicating need for more acute intervention. Patient verbalized understanding. After Visit Summary given.   Rennis Harding, PA-C 02/07/18 1304

## 2018-02-07 NOTE — Discharge Instructions (Addendum)
Use eye drops as prescribed and to completion Wash pillow cases, wash hands regularly with soap and water, avoid touching your face and eyes, wash door handles, light switches, remotes and other objects you frequently touch Follow up with ophthalmology as needed for further evaluation and management  Return or go to the ED if you have any new or worsening symptoms such as eye pain, worsening symptoms, vision changes, etc..Marland Kitchen

## 2018-02-07 NOTE — ED Triage Notes (Signed)
Pt states he has pink eye this started last night.

## 2018-02-08 ENCOUNTER — Encounter (HOSPITAL_COMMUNITY): Payer: Self-pay | Admitting: *Deleted

## 2018-02-08 ENCOUNTER — Ambulatory Visit (HOSPITAL_COMMUNITY)
Admission: EM | Admit: 2018-02-08 | Discharge: 2018-02-08 | Disposition: A | Discharge status: Home / Self Care | Payer: Self-pay | Attending: Internal Medicine | Admitting: Internal Medicine

## 2018-02-08 DIAGNOSIS — H1033 Unspecified acute conjunctivitis, bilateral: Secondary | ICD-10-CM

## 2018-02-08 DIAGNOSIS — H5789 Other specified disorders of eye and adnexa: Secondary | ICD-10-CM

## 2018-02-08 MED ORDER — AMOXICILLIN-POT CLAVULANATE 875-125 MG PO TABS: 1.0000 | ORAL_TABLET | Freq: Two times a day (BID) | ORAL | 0 refills | Status: AC

## 2018-02-08 MED ORDER — DOXYCYCLINE HYCLATE 100 MG PO CAPS
100.0000 mg | ORAL_CAPSULE | Freq: Two times a day (BID) | ORAL | 0 refills | Status: AC
Start: 2018-02-08 — End: ?

## 2018-02-08 MED ORDER — PREDNISONE 20 MG PO TABS: 20.0000 mg | ORAL_TABLET | Freq: Every day | ORAL | 0 refills | Status: AC

## 2018-02-08 NOTE — ED Triage Notes (Signed)
Per pt, was seen in [facility] yesterday for eye redness and eye swelling.  Has been taking Rx eye drops, but feels swelling and redness/pruritis getting worse.

## 2018-02-08 NOTE — ED Provider Notes (Signed)
[facility]   [id number] 02/08/18 Arrival Time: 1151  CC:EYE IRRITATION  SUBJECTIVE:  Thomas Cobb is a 37 y.o. male who presents with complaint of worsening eye redness and swelling within the past day.  Was seen at [facility] and treated for bacterial conjunctivitis with polytrim.  Denies improvement with drops.  Denies a precipitating event, trauma, or personal contact with bacterial conjunctivitis. Complains of associated itching.  Denies aggravating factors.  Complains of associated eye discharge and drainage.  Denies eye pain, vision changes, double vision, FB sensation.     Denies contact lens use.    ROS: As per HPI.  Past Medical History:  Diagnosis Date  . Cocaine abuse (HCC)   . Dental caries   . Depression    History reviewed. No pertinent surgical history. No Known Allergies No current facility-administered medications on file prior to encounter.    Current Outpatient Medications on File Prior to Encounter  Medication Sig Dispense Refill  . trimethoprim-polymyxin b (POLYTRIM) ophthalmic solution Place 1 drop into both eyes 4 (four) times daily for 7 days. 10 mL 0   Social History   Socioeconomic History  . Marital status: Single    Spouse name: Not on file  . Number of children: Not on file  . Years of education: Not on file  . Highest education level: Not on file  Occupational History  . Not on file  Social Needs  . Financial resource strain: Not on file  . Food insecurity:    Worry: Not on file    Inability: Not on file  . Transportation needs:    Medical: Not on file    Non-medical: Not on file  Tobacco Use  . Smoking status: Current Every Day Smoker    Packs/day: 0.50    Years: 15.00    Pack years: 7.50    Types: Cigarettes  . Smokeless tobacco: Never Used  Substance and Sexual Activity  . Alcohol use: Yes    Comment: a few beers per week  . Drug use: Yes    Types: Cocaine    Comment: cocaine and molly  . Sexual activity: Not on  file  Lifestyle  . Physical activity:    Days per week: Not on file    Minutes per session: Not on file  . Stress: Not on file  Relationships  . Social connections:    Talks on phone: Not on file    Gets together: Not on file    Attends religious service: Not on file    Active member of club or organization: Not on file    Attends meetings of clubs or organizations: Not on file    Relationship status: Not on file  . Intimate partner violence:    Fear of current or ex partner: Not on file    Emotionally abused: Not on file    Physically abused: Not on file    Forced sexual activity: Not on file  Other Topics Concern  . Not on file  Social History Narrative  . Not on file   Family History  Problem Relation Age of Onset  . Diabetes Mother     OBJECTIVE:    Visual Acuity  Right Eye Distance: 20/15 Left Eye Distance: 20/15 -1 Bilateral Distance: 20/15  Right Eye Near:   Left Eye Near:    Bilateral Near:      Vitals:   02/08/18 1307  BP: 121/78  Pulse: 60  Resp: 16  Temp: 98.2 F (36.8  C)  TempSrc: Oral  SpO2: 99%    General appearance: alert; no distress Eyes: Bilateral conjunctival erythema. Periorbital erythema and swelling; PERRL; EOMI without discomfort;  Obvious clear drainage.  Neck: supple Lungs: clear to auscultation bilaterally Heart: regular rate and rhythm Skin: warm and dry Psychological: alert and cooperative; normal mood and affect        ASSESSMENT & PLAN:  1. Acute conjunctivitis of both eyes, unspecified acute conjunctivitis type   2. Eye swelling, bilateral     Meds ordered this encounter  Medications  . doxycycline (VIBRAMYCIN) 100 MG capsule    Sig: Take 1 capsule (100 mg total) by mouth 2 (two) times daily.    Dispense:  20 capsule    Refill:  0    Order Specific Question:   Supervising Provider    Answer:   Isa Rankin [phone]  . amoxicillin-clavulanate (AUGMENTIN) 875-125 MG tablet    Sig: Take 1 tablet by  mouth every 12 (twelve) hours for 10 days.    Dispense:  20 tablet    Refill:  0    Order Specific Question:   Supervising Provider    Answer:   Isa Rankin [phone]  . predniSONE (DELTASONE) 20 MG tablet    Sig: Take 1 tablet (20 mg total) by mouth daily for 5 days.    Dispense:  5 tablet    Refill:  0    Order Specific Question:   Supervising Provider    Answer:   Isa Rankin [[id number]]   Symptoms may be allergic vs. Viral conjunctivitis, but concern for preseptal cellulitis.   Continue with eye drops as prescribed Prescribed doxycycline and augmentin.  Take as prescribed and to completion Prednisone prescribed.  Take as directed for swelling.   Follow up with ophthalmologist in 24 hours for further evaluation and management  Reviewed expectations re: course of current medical issues. Questions answered. Outlined signs and symptoms indicating need for more acute intervention. Patient verbalized understanding. After Visit Summary given.   Rennis Harding, PA-C 02/08/18 1638

## 2018-02-08 NOTE — Discharge Instructions (Signed)
Symptoms may be allergic vs. Viral conjunctivitis, but concern for preseptal cellulitis.   Continue with eye drops as prescribed Prescribed doxycycline and augmentin.  Take as prescribed and to completion Prednisone prescribed.  Take as directed for swelling.   Follow up with ophthalmologist in 24 hours for further evaluation and management

## 2018-02-12 ENCOUNTER — Encounter (HOSPITAL_COMMUNITY): Payer: Self-pay | Admitting: *Deleted

## 2018-02-12 ENCOUNTER — Emergency Department (HOSPITAL_COMMUNITY)
Admission: EM | Admit: 2018-02-12 | Discharge: 2018-02-12 | Disposition: A | Discharge status: Home / Self Care | Payer: No Typology Code available for payment source | Attending: Emergency Medicine | Admitting: Emergency Medicine

## 2018-02-12 DIAGNOSIS — F17210 Nicotine dependence, cigarettes, uncomplicated: Secondary | ICD-10-CM | POA: Diagnosis not present

## 2018-02-12 DIAGNOSIS — H11433 Conjunctival hyperemia, bilateral: Secondary | ICD-10-CM | POA: Diagnosis present

## 2018-02-12 DIAGNOSIS — F1490 Cocaine use, unspecified, uncomplicated: Secondary | ICD-10-CM | POA: Insufficient documentation

## 2018-02-12 DIAGNOSIS — H109 Unspecified conjunctivitis: Secondary | ICD-10-CM

## 2018-02-12 DIAGNOSIS — H10023 Other mucopurulent conjunctivitis, bilateral: Secondary | ICD-10-CM | POA: Insufficient documentation

## 2018-02-12 MED ORDER — FLUORESCEIN SODIUM 1 MG OP STRP
2.0000 | ORAL_STRIP | Freq: Once | OPHTHALMIC | Status: AC
  Administered 2018-02-12: 2 via OPHTHALMIC
  Filled 2018-02-12: qty 2

## 2018-02-12 MED ORDER — OLOPATADINE HCL 0.2 % OP SOLN: 2.0000 [drp] | Freq: Three times a day (TID) | OPHTHALMIC | 0 refills | Status: AC

## 2018-02-12 MED ORDER — TETRACAINE HCL 0.5 % OP SOLN
2.0000 [drp] | Freq: Once | OPHTHALMIC | Status: AC
  Administered 2018-02-12: 2 [drp] via OPHTHALMIC
  Filled 2018-02-12: qty 4

## 2018-02-12 NOTE — ED Provider Notes (Signed)
[facility]-EMERGENCY DEPT Provider Note  CSN: [id number] Arrival date & time: 02/12/18  1652   History   Chief Complaint Chief Complaint  Patient presents with  . Eye Problem    HPI Thomas Cobb is a 37 y.o. male with no signifcant medical history who presented to the ED for bilateral eye redness x7 days. Overall, patient says that his eyes are less itchy and less red at onset, but came to the ED because the redness has not completely resolved. Additional history obtained by medical chart. Patient seen by urgent care on 02/07/18 and 02/08/18 for the same issue. In addition to prescribed PO and ophthalmic antibiotics, he has tried 3-4 OTC saline eye drops for redness.  Eye Problem   Episode onset: 7 days ago. The problem occurs constantly. The problem has been gradually improving. There is a problem in both eyes. There was no injury mechanism. The patient is experiencing no pain. There is no history of trauma to the eye. There is no known exposure to pink eye. He does not wear contacts. Associated symptoms include eye redness and itching. Pertinent negatives include no numbness, no blurred vision, no decreased vision, no discharge, no double vision, no foreign body sensation, no photophobia and no tingling. Associated symptoms comments: Tearing/clear discharge. Treatments tried: PO and ophthalmic antibiotics. The treatment provided moderate relief.    Past Medical History:  Diagnosis Date  . Cocaine abuse (HCC)   . Dental caries   . Depression     Patient Active Problem List   Diagnosis Date Noted  . PTSD (post-traumatic stress disorder) 12/28/2012  . Cocaine abuse with cocaine-induced mood disorder (HCC) 12/26/2012  . Alcohol dependence (HCC) 12/26/2012  . Alcohol abuse 12/26/2012    History reviewed. No pertinent surgical history.      Home Medications    Prior to Admission medications   Medication Sig Start Date End Date Taking? Authorizing  Provider  amoxicillin-clavulanate (AUGMENTIN) 875-125 MG tablet Take 1 tablet by mouth every 12 (twelve) hours for 10 days. 02/08/18 02/18/18  Wurst, Lowanda Foster, PA-C  doxycycline (VIBRAMYCIN) 100 MG capsule Take 1 capsule (100 mg total) by mouth 2 (two) times daily. 02/08/18   Wurst, [place], PA-C  Olopatadine HCl (PATADAY) 0.2 % SOLN Apply 2 drops to eye 3 (three) times daily. 02/12/18   Khalilah Hoke, Jerrel Ivory I, PA-C  predniSONE (DELTASONE) 20 MG tablet Take 1 tablet (20 mg total) by mouth daily for 5 days. 02/08/18 02/13/18  Wurst, [place], PA-C  trimethoprim-polymyxin b (POLYTRIM) ophthalmic solution Place 1 drop into both eyes 4 (four) times daily for 7 days. 02/07/18 02/14/18  Rennis Harding, PA-C    Family History Family History  Problem Relation Age of Onset  . Diabetes Mother     Social History Social History   Tobacco Use  . Smoking status: Current Every Day Smoker    Packs/day: 0.50    Years: 15.00    Pack years: 7.50    Types: Cigarettes  . Smokeless tobacco: Never Used  Substance Use Topics  . Alcohol use: Yes    Comment: a few beers per week  . Drug use: Yes    Types: Cocaine    Comment: cocaine and molly     Allergies   Patient has no known allergies.   Review of Systems Review of Systems  Constitutional: Negative.   HENT: Negative.   Eyes: Positive for redness and itching. Negative for blurred vision, double vision, photophobia, pain, discharge and visual disturbance.  Skin:  Positive for itching.  Neurological: Negative for tingling and numbness.  Hematological: Negative.    Physical Exam Updated Vital Signs BP 120/80 (BP Location: Right Arm)   Pulse 86   Temp 99 F (37.2 C) (Oral)   Resp 18   SpO2 97%   Physical Exam  Constitutional: He appears well-developed and well-nourished.  HENT:  Head: Normocephalic and atraumatic. Head is without right periorbital erythema and without left periorbital erythema.  Right Ear: Tympanic membrane, external ear and  ear canal normal.  Left Ear: Tympanic membrane, external ear and ear canal normal.  Eyes: Pupils are equal, round, and reactive to light. EOM and lids are normal. Right eye exhibits no chemosis and no discharge. Left eye exhibits no chemosis and no discharge. Right conjunctiva is injected. Left conjunctiva is injected.  Slit lamp exam:      The right eye shows no corneal abrasion, no foreign body and no hyphema.       The left eye shows no corneal abrasion, no foreign body and no hyphema.  Skin: Skin is warm. Capillary refill takes less than 2 seconds. No rash noted.  Nursing note and vitals reviewed.    ED Treatments / Results  Labs (all labs ordered are listed, but only abnormal results are displayed) Labs Reviewed - No data to display  EKG None  Radiology No results found.  Procedures Procedures (including critical care time)  Medications Ordered in ED Medications  fluorescein ophthalmic strip 2 strip (2 strips Both Eyes Given 02/12/18 2031)  tetracaine (PONTOCAINE) 0.5 % ophthalmic solution 2 drop (2 drops Both Eyes Given 02/12/18 2035)    Initial Impression / Assessment and Plan / ED Course  Triage vital signs and the nursing notes have been reviewed.  Pertinent labs & imaging results that were available during care of the patient were reviewed and considered in medical decision making (see chart for details).   Patient presents to the ED for bilateral eye redness and itching which has been present for 7 days. Patient states that symptoms are resolving, but still has eye redness. There are pictures in the chart from urgent care and objectively, patient's eyes look less erythematous on presentation today. Physical exam significant for eye redness only. There is no discharge, foreign body or abnormalities with fluorescein stain. No physical exam findings or history to suggest glaucoma, preseptal cellulitis or other ophthalmic emergencies. Findings most consistent with an allergic  or viral conjunctivitis.  Final Clinical Impressions(s) / ED Diagnoses  1. Bilateral Conjunctivitis. Resolving. Likely allergic or viral etiology. Education provided on OTC and supportive treatment. Rx for Pataday for relief. Advised to follow-up with eye provider referred by urgent care.  Dispo: Home. After thorough clinical evaluation, this patient is determined to be medically stable and can be safely discharged with the previously mentioned treatment and/or outpatient follow-up/referral(s). At this time, there are no other apparent medical conditions that require further screening, evaluation or treatment.   Final diagnoses:  Conjunctivitis of both eyes, unspecified conjunctivitis type    ED Discharge Orders         Ordered    Olopatadine HCl (PATADAY) 0.2 % SOLN  3 times daily     02/12/18 2041            Dagoberto Ligas I, PA-C 02/13/18 1353    Melene Plan, DO 02/13/18 [phone]

## 2018-02-12 NOTE — ED Triage Notes (Signed)
Pt has been seen for eye redness and swelling, given oral antibiotics and eyedrops, but states symptoms are not improving.

## 2018-02-12 NOTE — Discharge Instructions (Addendum)
Your symptoms are consistent with an allergic conjunctivitis. I am glad to hear that some of your symptoms are getting better. I have prescribed you a different type of eye drop that should help better with the redness. I recommend that you continue taking allergy medicine as well.  Please follow-up with the eye doctor that the urgent care recommended.  Follow-up with a medical provider sooner if you have any of the following symptoms: pain with eye movements; thick/green/yellow discharge; eye pain at rest; decrease in vision.  Thank you for allowing me to take care of you today!

## 2018-02-23 ENCOUNTER — Other Ambulatory Visit: Payer: Self-pay | Admitting: *Deleted

## 2018-02-23 DIAGNOSIS — M79641 Pain in right hand: Secondary | ICD-10-CM

## 2018-02-26 ENCOUNTER — Encounter: Payer: Self-pay | Admitting: Neurology

## 2018-04-07 ENCOUNTER — Ambulatory Visit (INDEPENDENT_AMBULATORY_CARE_PROVIDER_SITE_OTHER): Payer: Self-pay | Admitting: Neurology

## 2018-04-07 DIAGNOSIS — G5621 Lesion of ulnar nerve, right upper limb: Secondary | ICD-10-CM

## 2018-04-07 DIAGNOSIS — M79641 Pain in right hand: Secondary | ICD-10-CM

## 2018-04-07 NOTE — Procedures (Signed)
[facility]  [address] [place], [address]  [place], [place] [address] Tel: [phone] Fax:  [phone] Test Date:  04/07/2018  Patient: Thomas Cobb DOB: 27-Aug-1980 Physician: Nita Sickle, DO  Sex: Male Height: 6\' 2"  Ref Phys: Herminio Heads, MD  ID#: [id number] Temp: 35.0C Technician:    Patient Complaints: This is a 37 year old man referred for evaluation of right forearm pain and paresthesias.  NCV & EMG Findings: Extensive electrodiagnostic testing of the right upper extremity shows:  1. All sensory responses including the right median, ulnar, radial, mixed palmar, medial and lateral antebrachial cutaneous sensory nerve are within normal limits. 2. Right ulnar motor response shows normal latency, normal amplitude, and conduction velocity slowing across the elbow (A Elbow-B Elbow, R42, R40 m/s).  Right median motor responses within normal limits.   3. There is no evidence of active or chronic motor axonal loss changes affecting any of the tested muscles.  Motor unit configuration and recruitment pattern is within normal limits.    Impression: Right ulnar neuropathy with slowing across the elbow, purely demyelinating in type, which is mild in degree electrically.  There is no evidence of carpal tunnel syndrome, brachial plexopathy, or cervical radiculopathy affecting the right upper extremity.   ___________________________ Nita Sickle, DO    Nerve Conduction Studies Anti Sensory Summary Table   Site NR Peak (ms) Norm Peak (ms) P-T Amp (V) Norm P-T Amp  Right Lat Ante Brach Cutan Anti Sensory (Lat Forearm)  35C  Lat Biceps    2.3 <2.9 14.6 >14  Right Med Ante Brach Cutan Anti Sensory (Med Forearm)  35C  Elbow    3.0  14.7 >10  Right Median Anti Sensory (2nd Digit)  35C  Wrist    2.8 <3.4 39.3 >20  Right Radial Anti Sensory (Base 1st Digit)  35C  Wrist    2.5 <2.7 23.6 >18  Right Ulnar Anti Sensory (5th Digit)  35C  Wrist    3.0 <3.1 18.1 >12     Motor Summary Table   Site NR Onset (ms) Norm Onset (ms) O-P Amp (mV) Norm O-P Amp Site1 Site2 Delta-0 (ms) Dist (cm) Vel (m/s) Norm Vel (m/s)  Right Median Motor (Abd Poll Brev)  35C  Wrist    2.5 <3.9 10.3 >6 Elbow Wrist 5.5 33.0 60 >50  Elbow    8.0  9.9         Right Ulnar Motor (Abd Dig Minimi)  35C  Wrist    2.0 <3.1 9.1 >7 B Elbow Wrist 4.0 25.0 63 >50  B Elbow    6.0  8.8  A Elbow B Elbow 2.4 10.0 42 >50  A Elbow    8.4  8.4         Right Ulnar (FDI) Motor (1st DI)  35C  Wrist    3.8 <4.3 17.0 >7 B Elbow Wrist 3.9 25.0 64 >50  B Elbow    7.7  16.6  A Elbow B Elbow 2.5 10.0 40 >50  A Elbow    10.2  16.1          Comparison Summary Table   Site NR Peak (ms) Norm Peak (ms) P-T Amp (V) Site1 Site2 Delta-P (ms) Norm Delta (ms)  Right Median/Ulnar Palm Comparison (Wrist - 8cm)  35C  Median Palm    1.6 <2.2 44.8 Median Palm Ulnar Palm 0.0   Ulnar Palm    1.6 <2.2 17.2       EMG   Side Muscle  Ins Act Fibs Psw Fasc Number Recrt Dur Dur. Amp Amp. Poly Poly. Comment  Right 1stDorInt Nml Nml Nml Nml Nml Nml Nml Nml Nml Nml Nml Nml N/A  Right Abd Poll Brev Nml Nml Nml Nml Nml Nml Nml Nml Nml Nml Nml Nml N/A  Right ABD Dig Min Nml Nml Nml Nml Nml Nml Nml Nml Nml Nml Nml Nml N/A  Right Ext Indicis Nml Nml Nml Nml Nml Nml Nml Nml Nml Nml Nml Nml N/A  Right PronatorTeres Nml Nml Nml Nml Nml Nml Nml Nml Nml Nml Nml Nml N/A  Right Biceps Nml Nml Nml Nml Nml Nml Nml Nml Nml Nml Nml Nml N/A  Right Triceps Nml Nml Nml Nml Nml Nml Nml Nml Nml Nml Nml Nml N/A  Right Deltoid Nml Nml Nml Nml Nml Nml Nml Nml Nml Nml Nml Nml N/A  Right FlexCarpiUln Nml Nml Nml Nml Nml Nml Nml Nml Nml Nml Nml Nml N/A      Waveforms:

## 2023-05-12 ENCOUNTER — Ambulatory Visit (HOSPITAL_COMMUNITY)
Admission: EM | Admit: 2023-05-12 | Discharge: 2023-05-12 | Disposition: A | Discharge status: Home / Self Care | Payer: Self-pay

## 2023-05-12 ENCOUNTER — Encounter (HOSPITAL_COMMUNITY): Payer: Self-pay | Admitting: Emergency Medicine

## 2023-05-12 DIAGNOSIS — R030 Elevated blood-pressure reading, without diagnosis of hypertension: Secondary | ICD-10-CM

## 2023-05-12 NOTE — ED Triage Notes (Signed)
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  Patient was advised to have his BP rechecked and documented by a physician.  Patient does not have a PCP.  No sx's of HTN.  Denies HA's or blurred vision.

## 2023-05-12 NOTE — Discharge Instructions (Addendum)
Blood pressure was normal today at 130/78.  Exam was normal.  No history of hypertension.  Encouraged to connect with the PCP and get further workup given his age of 16 years.  This time to start getting annual physicals lab work and screenings.  Provided verbal and written education on healthy diet and lifestyle changes to prevent Hypertension  Return here if symptoms do not resolve, worsen or if new symptoms occur.

## 2023-05-12 NOTE — ED Provider Notes (Signed)
[facility]    CSN: [id number] Arrival date & time: 05/12/23  4098      History   Chief Complaint Chief Complaint  Patient presents with   Elevated BP    HPI Thomas Cobb is a 42 y.o. male.   Patient had a employment physical on 04/30/2023.  He had been up since 4 AM that morning and got off work at 330 and rushed to the appointment.  During the visit his blood pressure was 148/120 and a recheck was 150/115.  He was required to come to another doctor and get a blood pressure check and bring back a completed form about the findings.  He does not yet have a family practice provider so he came to urgent care today.  His blood pressure today is 130/78.     Past Medical History:  Diagnosis Date   Cocaine abuse (HCC)    Dental caries    Depression     Patient Active Problem List   Diagnosis Date Noted   Elevated blood pressure reading in office without diagnosis of hypertension 05/12/2023   PTSD (post-traumatic stress disorder) 12/28/2012   Cocaine abuse with cocaine-induced mood disorder (HCC) 12/26/2012   Alcohol dependence (HCC) 12/26/2012   Alcohol abuse 12/26/2012    History reviewed. No pertinent surgical history.     Home Medications    Prior to Admission medications   Medication Sig Start Date End Date Taking? Authorizing Provider  doxycycline (VIBRAMYCIN) 100 MG capsule Take 1 capsule (100 mg total) by mouth 2 (two) times daily. 02/08/18   Wurst, [place], PA-C  Olopatadine HCl (PATADAY) 0.2 % SOLN Apply 2 drops to eye 3 (three) times daily. 02/12/18   Mortis, Sharyon Medicus, PA-C    Family History Family History  Problem Relation Age of Onset   Healthy Mother    Diabetes Mother    Healthy Father     Social History Social History   Tobacco Use   Smoking status: Former    Current packs/day: 0.50    Average packs/day: 0.5 packs/day for 15.0 years (7.5 ttl pk-yrs)    Types: Cigarettes   Smokeless tobacco: Never  Vaping Use   Vaping  status: Every Day  Substance Use Topics   Alcohol use: Not Currently    Comment: a few beers per week   Drug use: Not Currently    Types: Cocaine    Comment: cocaine and molly     Allergies   Patient has no known allergies.   Review of Systems Review of Systems  Constitutional:  Negative for chills and fever.  HENT:  Negative for ear pain and sore throat.   Eyes:  Negative for pain and visual disturbance.  Respiratory:  Negative for cough and shortness of breath.   Cardiovascular:  Negative for chest pain and palpitations.  Gastrointestinal:  Negative for abdominal pain and vomiting.  Genitourinary:  Negative for dysuria and hematuria.  Musculoskeletal:  Negative for arthralgias and back pain.  Skin:  Negative for color change and rash.  Neurological:  Negative for seizures and syncope.  All other systems reviewed and are negative.    Physical Exam Triage Vital Signs ED Triage Vitals  Encounter Vitals Group     BP 05/12/23 1034 130/78     Systolic BP Percentile --      Diastolic BP Percentile --      Pulse Rate 05/12/23 1034 81     Resp 05/12/23 1034 16     Temp 05/12/23  1034 97.8 F (36.6 C)     Temp Source 05/12/23 1034 Oral     SpO2 05/12/23 1034 97 %     Weight 05/12/23 1035 150 lb (68 kg)     Height 05/12/23 1035 6\' 2"  (1.88 m)     Head Circumference --      Peak Flow --      Pain Score 05/12/23 1035 0     Pain Loc --      Pain Education --      Exclude from Growth Chart --    No data found.  Updated Vital Signs BP 130/78 (BP Location: Left Arm)   Pulse 81   Temp 97.8 F (36.6 C) (Oral)   Resp 16   Ht 6\' 2"  (1.88 m)   Wt 150 lb (68 kg)   SpO2 97%   BMI 19.26 kg/m   Visual Acuity Right Eye Distance:   Left Eye Distance:   Bilateral Distance:    Right Eye Near:   Left Eye Near:    Bilateral Near:     Physical Exam Vitals and nursing note reviewed.  Constitutional:      General: He is not in acute distress.    Appearance: He is  well-developed.  HENT:     Head: Normocephalic and atraumatic.     Right Ear: Hearing, tympanic membrane, ear canal and external ear normal.     Left Ear: Hearing, tympanic membrane, ear canal and external ear normal.     Nose: Rhinorrhea (Mild rhinorrhea with clear nasal discharge noted.) present.     Mouth/Throat:     Lips: Pink.     Pharynx: Uvula midline. Oropharyngeal exudate present. No posterior oropharyngeal erythema.     Tonsils: No tonsillar exudate.  Eyes:     Conjunctiva/sclera: Conjunctivae normal.     Pupils: Pupils are equal, round, and reactive to light.  Cardiovascular:     Rate and Rhythm: Normal rate and regular rhythm.     Pulses:          Radial pulses are 2+ on the right side and 2+ on the left side.       Posterior tibial pulses are 2+ on the right side and 2+ on the left side.     Heart sounds: S1 normal and S2 normal. No murmur heard. Pulmonary:     Effort: Pulmonary effort is normal. No respiratory distress.     Breath sounds: Normal breath sounds.  Abdominal:     Palpations: Abdomen is soft.     Tenderness: There is no abdominal tenderness.  Musculoskeletal:        General: No swelling.     Cervical back: Neck supple.     Right lower leg: No edema.     Left lower leg: No edema.  Lymphadenopathy:     Cervical: No cervical adenopathy.  Skin:    General: Skin is warm and dry.     Capillary Refill: Capillary refill takes less than 2 seconds.     Findings: No rash.  Neurological:     Mental Status: He is alert and oriented to person, place, and time.  Psychiatric:        Mood and Affect: Mood normal. Mood is not anxious.      UC Treatments / Results  Labs (all labs ordered are listed, but only abnormal results are displayed) Labs Reviewed - No data to display  EKG   Radiology No results found.  Procedures Procedures (including critical  care time)  Medications Ordered in UC Medications - No data to display  Initial Impression /  Assessment and Plan / UC Course  I have reviewed the triage vital signs and the nursing notes.  Pertinent labs & imaging results that were available during my care of the patient were reviewed by me and considered in my medical decision making (see chart for details).  Elevated blood pressure: Exam was normal.  Blood pressure was normal.  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  Still needs to find a primary care provider, and get seen evaluated.  Educated on hypertension.  Provided verbal and written instructions on how to prevent hypertension including exercise, working to prevent stress, good sleep, low-salt diet.  Return here if symptoms do not improve, worsen or if new symptoms occur. Final Clinical Impressions(s) / UC Diagnoses   Final diagnoses:  Elevated blood pressure reading in office without diagnosis of hypertension     Discharge Instructions      Blood pressure was normal today at 130/78.  Exam was normal.  No history of hypertension.  Encouraged to connect with the PCP and get further workup given his age of 39 years.  This time to start getting annual physicals lab work and screenings.  Provided verbal and written education on healthy diet and lifestyle changes to prevent Hypertension  Return here if symptoms do not resolve, worsen or if new symptoms occur.     ED Prescriptions   None    PDMP not reviewed this encounter.   Prescilla Sours, FNP 05/12/23 1106

## 2023-09-08 ENCOUNTER — Emergency Department (HOSPITAL_BASED_OUTPATIENT_CLINIC_OR_DEPARTMENT_OTHER): Payer: Self-pay | Admitting: Radiology

## 2023-09-08 ENCOUNTER — Encounter (HOSPITAL_BASED_OUTPATIENT_CLINIC_OR_DEPARTMENT_OTHER): Payer: Self-pay

## 2023-09-08 ENCOUNTER — Other Ambulatory Visit: Payer: Self-pay

## 2023-09-08 ENCOUNTER — Emergency Department (HOSPITAL_BASED_OUTPATIENT_CLINIC_OR_DEPARTMENT_OTHER): Payer: Self-pay

## 2023-09-08 ENCOUNTER — Emergency Department (HOSPITAL_BASED_OUTPATIENT_CLINIC_OR_DEPARTMENT_OTHER)
Admission: EM | Admit: 2023-09-08 | Discharge: 2023-09-08 | Disposition: A | Discharge status: Home / Self Care | Payer: Self-pay | Attending: Emergency Medicine | Admitting: Emergency Medicine

## 2023-09-08 DIAGNOSIS — M542 Cervicalgia: Secondary | ICD-10-CM | POA: Diagnosis not present

## 2023-09-08 DIAGNOSIS — S12600A Unspecified displaced fracture of seventh cervical vertebra, initial encounter for closed fracture: Secondary | ICD-10-CM | POA: Diagnosis not present

## 2023-09-08 DIAGNOSIS — S14109A Unspecified injury at unspecified level of cervical spinal cord, initial encounter: Secondary | ICD-10-CM | POA: Diagnosis present

## 2023-09-08 DIAGNOSIS — I10 Essential (primary) hypertension: Secondary | ICD-10-CM | POA: Insufficient documentation

## 2023-09-08 DIAGNOSIS — Y92410 Unspecified street and highway as the place of occurrence of the external cause: Secondary | ICD-10-CM | POA: Insufficient documentation

## 2023-09-08 DIAGNOSIS — M25512 Pain in left shoulder: Secondary | ICD-10-CM | POA: Insufficient documentation

## 2023-09-08 LAB — CBC
HCT: 42.8 % (ref 39.0–52.0)
Hemoglobin: 15.8 g/dL (ref 13.0–17.0)
MCH: 31 pg (ref 26.0–34.0)
MCHC: 36.9 g/dL — ABNORMAL HIGH (ref 30.0–36.0)
MCV: 83.9 fL (ref 80.0–100.0)
Platelets: 276 10*3/uL (ref 150–400)
RBC: 5.1 MIL/uL (ref 4.22–5.81)
RDW: 12.5 % (ref 11.5–15.5)
WBC: 11 10*3/uL — ABNORMAL HIGH (ref 4.0–10.5)
nRBC: 0 % (ref 0.0–0.2)

## 2023-09-08 LAB — BASIC METABOLIC PANEL WITH GFR
Anion gap: 11 (ref 5–15)
BUN: 10 mg/dL (ref 6–20)
CO2: 29 mmol/L (ref 22–32)
Calcium: 9.9 mg/dL (ref 8.9–10.3)
Chloride: 101 mmol/L (ref 98–111)
Creatinine, Ser: 1.09 mg/dL (ref 0.61–1.24)
GFR, Estimated: >60 mL/min (ref >60)
Glucose, Bld: 85 mg/dL (ref 70–99)
Potassium: 3.6 mmol/L (ref 3.5–5.1)
Sodium: 141 mmol/L (ref 135–145)

## 2023-09-08 MED ORDER — IOHEXOL 350 MG/ML SOLN
100.0000 mL | Freq: Once | INTRAVENOUS | Status: AC | PRN
  Administered 2023-09-08: 100 mL via INTRAVENOUS

## 2023-09-08 MED ORDER — CYCLOBENZAPRINE HCL 10 MG PO TABS
10.0000 mg | ORAL_TABLET | Freq: Once | ORAL | Status: AC
  Administered 2023-09-08: 10 mg via ORAL
  Filled 2023-09-08: qty 1

## 2023-09-08 MED ORDER — CYCLOBENZAPRINE HCL 10 MG PO TABS: 10.0000 mg | ORAL_TABLET | Freq: Two times a day (BID) | ORAL | 0 refills | Status: AC | PRN

## 2023-09-08 MED ORDER — HYDROCODONE-ACETAMINOPHEN 5-325 MG PO TABS
1.0000 | ORAL_TABLET | Freq: Once | ORAL | Status: AC
  Administered 2023-09-08: 1 via ORAL
  Filled 2023-09-08: qty 1

## 2023-09-08 MED ORDER — OXYCODONE-ACETAMINOPHEN 5-325 MG PO TABS
1.0000 | ORAL_TABLET | Freq: Once | ORAL | Status: AC
  Administered 2023-09-08: 1 via ORAL
  Filled 2023-09-08: qty 1

## 2023-09-08 MED ORDER — CELECOXIB 200 MG PO CAPS: 200.0000 mg | ORAL_CAPSULE | Freq: Two times a day (BID) | ORAL | 0 refills | Status: AC | PRN

## 2023-09-08 MED ORDER — KETOROLAC TROMETHAMINE 30 MG/ML IJ SOLN: 30.0000 mg | Freq: Once | INTRAMUSCULAR | Status: DC

## 2023-09-08 NOTE — ED Notes (Signed)
 Pt transported to imaging.
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
## 2023-09-11 ENCOUNTER — Other Ambulatory Visit: Payer: Self-pay

## 2023-09-11 ENCOUNTER — Emergency Department (HOSPITAL_COMMUNITY)
Admission: EM | Admit: 2023-09-11 | Discharge: 2023-09-11 | Disposition: A | Discharge status: Home / Self Care | Payer: Self-pay | Attending: Emergency Medicine | Admitting: Emergency Medicine

## 2023-09-11 ENCOUNTER — Encounter (HOSPITAL_COMMUNITY): Payer: Self-pay

## 2023-09-11 DIAGNOSIS — M542 Cervicalgia: Secondary | ICD-10-CM | POA: Insufficient documentation

## 2023-09-11 MED ORDER — PREDNISONE 20 MG PO TABS
60.0000 mg | ORAL_TABLET | Freq: Once | ORAL | Status: AC
  Administered 2023-09-11: 60 mg via ORAL
  Filled 2023-09-11: qty 3

## 2023-09-11 MED ORDER — METHYLPREDNISOLONE 4 MG PO TBPK: ORAL_TABLET | ORAL | 0 refills | Status: AC

## 2023-09-11 MED ORDER — OXYCODONE-ACETAMINOPHEN 5-325 MG PO TABS: 1.0000 | ORAL_TABLET | ORAL | 0 refills | Status: AC | PRN

## 2023-09-11 MED ORDER — OXYCODONE-ACETAMINOPHEN 5-325 MG PO TABS
1.0000 | ORAL_TABLET | Freq: Once | ORAL | Status: AC
  Administered 2023-09-11: 1 via ORAL
  Filled 2023-09-11: qty 1
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
## 2023-09-11 NOTE — Discharge Instructions (Signed)
 Schedule follow-up with a neurosurgeon as soon as possible.  Return for worsening symptoms.
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
